# Patient Record
Sex: Female | Born: 1935
Health system: Southern US, Community
[De-identification: ages and names within clinical notes are randomized; demographics above are authoritative.]

## PROBLEM LIST (undated history)

## (undated) DIAGNOSIS — Z923 Personal history of irradiation: Secondary | ICD-10-CM

## (undated) DIAGNOSIS — IMO0002 Reserved for concepts with insufficient information to code with codable children: Secondary | ICD-10-CM

## (undated) DIAGNOSIS — E785 Hyperlipidemia, unspecified: Secondary | ICD-10-CM

## (undated) DIAGNOSIS — Z973 Presence of spectacles and contact lenses: Secondary | ICD-10-CM

## (undated) DIAGNOSIS — C50919 Malignant neoplasm of unspecified site of unspecified female breast: Secondary | ICD-10-CM

## (undated) DIAGNOSIS — IMO0001 Reserved for inherently not codable concepts without codable children: Secondary | ICD-10-CM

## (undated) HISTORY — DX: Malignant neoplasm of unspecified site of unspecified female breast: C50.919

## (undated) HISTORY — DX: Hyperlipidemia, unspecified: E78.5

## (undated) HISTORY — PX: TONSILLECTOMY: SUR1361

## (undated) HISTORY — DX: Reserved for inherently not codable concepts without codable children: IMO0001

## (undated) HISTORY — DX: Reserved for concepts with insufficient information to code with codable children: IMO0002

---

## 1989-10-29 HISTORY — PX: VAGINAL HYSTERECTOMY: SUR661

## 2010-08-23 ENCOUNTER — Encounter: Admission: RE | Admit: 2010-08-23 | Discharge: 2010-08-23 | Payer: Self-pay | Admitting: Specialist

## 2011-01-16 ENCOUNTER — Other Ambulatory Visit: Payer: Self-pay | Admitting: Specialist

## 2011-01-16 DIAGNOSIS — Z09 Encounter for follow-up examination after completed treatment for conditions other than malignant neoplasm: Secondary | ICD-10-CM

## 2011-02-26 ENCOUNTER — Ambulatory Visit
Admission: RE | Admit: 2011-02-26 | Discharge: 2011-02-26 | Disposition: A | Discharge status: Home / Self Care | Payer: Medicare Other | Source: Ambulatory Visit | Attending: Specialist | Admitting: Specialist

## 2011-02-26 DIAGNOSIS — Z09 Encounter for follow-up examination after completed treatment for conditions other than malignant neoplasm: Secondary | ICD-10-CM

## 2011-06-28 ENCOUNTER — Other Ambulatory Visit: Payer: Self-pay | Admitting: Specialist

## 2011-06-28 DIAGNOSIS — R920 Mammographic microcalcification found on diagnostic imaging of breast: Secondary | ICD-10-CM

## 2011-08-27 ENCOUNTER — Other Ambulatory Visit: Payer: Self-pay | Admitting: Specialist

## 2011-08-27 ENCOUNTER — Ambulatory Visit
Admission: RE | Admit: 2011-08-27 | Discharge: 2011-08-27 | Disposition: A | Discharge status: Home / Self Care | Payer: Medicare Other | Source: Ambulatory Visit | Attending: Specialist | Admitting: Specialist

## 2011-08-27 DIAGNOSIS — R920 Mammographic microcalcification found on diagnostic imaging of breast: Secondary | ICD-10-CM

## 2012-04-14 DIAGNOSIS — Z872 Personal history of diseases of the skin and subcutaneous tissue: Secondary | ICD-10-CM | POA: Diagnosis not present

## 2012-04-14 DIAGNOSIS — L570 Actinic keratosis: Secondary | ICD-10-CM | POA: Diagnosis not present

## 2012-06-24 DIAGNOSIS — H2510 Age-related nuclear cataract, unspecified eye: Secondary | ICD-10-CM | POA: Diagnosis not present

## 2012-06-24 DIAGNOSIS — H02839 Dermatochalasis of unspecified eye, unspecified eyelid: Secondary | ICD-10-CM | POA: Diagnosis not present

## 2012-06-24 DIAGNOSIS — H40029 Open angle with borderline findings, high risk, unspecified eye: Secondary | ICD-10-CM | POA: Diagnosis not present

## 2012-07-21 ENCOUNTER — Other Ambulatory Visit: Payer: Self-pay | Admitting: Specialist

## 2012-07-21 DIAGNOSIS — Z1231 Encounter for screening mammogram for malignant neoplasm of breast: Secondary | ICD-10-CM

## 2012-07-23 DIAGNOSIS — E785 Hyperlipidemia, unspecified: Secondary | ICD-10-CM | POA: Diagnosis not present

## 2012-07-23 DIAGNOSIS — Z01419 Encounter for gynecological examination (general) (routine) without abnormal findings: Secondary | ICD-10-CM | POA: Diagnosis not present

## 2012-07-28 DIAGNOSIS — M899 Disorder of bone, unspecified: Secondary | ICD-10-CM | POA: Diagnosis not present

## 2012-07-28 DIAGNOSIS — Z01419 Encounter for gynecological examination (general) (routine) without abnormal findings: Secondary | ICD-10-CM | POA: Diagnosis not present

## 2012-08-15 DIAGNOSIS — Z23 Encounter for immunization: Secondary | ICD-10-CM | POA: Diagnosis not present

## 2012-09-03 ENCOUNTER — Ambulatory Visit
Admission: RE | Admit: 2012-09-03 | Discharge: 2012-09-03 | Disposition: A | Discharge status: Home / Self Care | Payer: Medicare Other | Source: Ambulatory Visit | Attending: Specialist | Admitting: Specialist

## 2012-09-03 DIAGNOSIS — Z1231 Encounter for screening mammogram for malignant neoplasm of breast: Secondary | ICD-10-CM | POA: Diagnosis not present

## 2012-10-09 DIAGNOSIS — L570 Actinic keratosis: Secondary | ICD-10-CM | POA: Diagnosis not present

## 2012-10-09 DIAGNOSIS — Z872 Personal history of diseases of the skin and subcutaneous tissue: Secondary | ICD-10-CM | POA: Diagnosis not present

## 2013-03-30 DIAGNOSIS — Z872 Personal history of diseases of the skin and subcutaneous tissue: Secondary | ICD-10-CM | POA: Diagnosis not present

## 2013-03-30 DIAGNOSIS — L408 Other psoriasis: Secondary | ICD-10-CM | POA: Diagnosis not present

## 2013-04-20 DIAGNOSIS — Z872 Personal history of diseases of the skin and subcutaneous tissue: Secondary | ICD-10-CM | POA: Diagnosis not present

## 2013-04-20 DIAGNOSIS — L408 Other psoriasis: Secondary | ICD-10-CM | POA: Diagnosis not present

## 2013-06-23 DIAGNOSIS — H269 Unspecified cataract: Secondary | ICD-10-CM | POA: Diagnosis not present

## 2013-06-23 DIAGNOSIS — H04129 Dry eye syndrome of unspecified lacrimal gland: Secondary | ICD-10-CM | POA: Diagnosis not present

## 2013-06-23 DIAGNOSIS — H02839 Dermatochalasis of unspecified eye, unspecified eyelid: Secondary | ICD-10-CM | POA: Diagnosis not present

## 2013-07-07 DIAGNOSIS — Z872 Personal history of diseases of the skin and subcutaneous tissue: Secondary | ICD-10-CM | POA: Diagnosis not present

## 2013-07-07 DIAGNOSIS — D481 Neoplasm of uncertain behavior of connective and other soft tissue: Secondary | ICD-10-CM | POA: Diagnosis not present

## 2013-07-07 DIAGNOSIS — D485 Neoplasm of uncertain behavior of skin: Secondary | ICD-10-CM | POA: Diagnosis not present

## 2013-07-07 DIAGNOSIS — L408 Other psoriasis: Secondary | ICD-10-CM | POA: Diagnosis not present

## 2013-07-28 DIAGNOSIS — E785 Hyperlipidemia, unspecified: Secondary | ICD-10-CM | POA: Diagnosis not present

## 2013-07-30 DIAGNOSIS — Z23 Encounter for immunization: Secondary | ICD-10-CM | POA: Diagnosis not present

## 2013-08-07 ENCOUNTER — Other Ambulatory Visit: Payer: Self-pay

## 2013-08-07 DIAGNOSIS — Z1231 Encounter for screening mammogram for malignant neoplasm of breast: Secondary | ICD-10-CM

## 2013-09-07 ENCOUNTER — Ambulatory Visit
Admission: RE | Admit: 2013-09-07 | Discharge: 2013-09-07 | Disposition: A | Discharge status: Home / Self Care | Payer: Medicare Other | Source: Ambulatory Visit

## 2013-09-07 DIAGNOSIS — Z1231 Encounter for screening mammogram for malignant neoplasm of breast: Secondary | ICD-10-CM | POA: Diagnosis not present

## 2013-09-10 ENCOUNTER — Other Ambulatory Visit: Payer: Self-pay | Admitting: Specialist

## 2013-09-10 DIAGNOSIS — R928 Other abnormal and inconclusive findings on diagnostic imaging of breast: Secondary | ICD-10-CM

## 2013-09-29 ENCOUNTER — Ambulatory Visit
Admission: RE | Admit: 2013-09-29 | Discharge: 2013-09-29 | Disposition: A | Discharge status: Home / Self Care | Payer: Medicare Other | Source: Ambulatory Visit | Attending: Specialist | Admitting: Specialist

## 2013-09-29 DIAGNOSIS — R928 Other abnormal and inconclusive findings on diagnostic imaging of breast: Secondary | ICD-10-CM

## 2013-09-29 DIAGNOSIS — R920 Mammographic microcalcification found on diagnostic imaging of breast: Secondary | ICD-10-CM | POA: Diagnosis not present

## 2013-10-06 ENCOUNTER — Ambulatory Visit (INDEPENDENT_AMBULATORY_CARE_PROVIDER_SITE_OTHER): Payer: Medicare Other | Admitting: Surgery

## 2013-10-06 ENCOUNTER — Encounter (INDEPENDENT_AMBULATORY_CARE_PROVIDER_SITE_OTHER): Payer: Self-pay | Admitting: Surgery

## 2013-10-06 VITALS — BP 156/72 | HR 104 | Temp 97.1°F | Resp 18 | Ht 66.0 in | Wt 160.0 lb

## 2013-10-06 DIAGNOSIS — R920 Mammographic microcalcification found on diagnostic imaging of breast: Secondary | ICD-10-CM | POA: Diagnosis not present

## 2013-10-06 NOTE — Patient Instructions (Signed)
Lumpectomy A lumpectomy is a form of "breast conserving" or "breast preservation" surgery. It may also be referred to as a partial mastectomy. During a lumpectomy, the portion of the breast that contains the cancerous tumor or breast mass (the lump) is removed. Some normal tissue around the lump may also be removed to make sure all the tumor has been removed. This surgery should take 40 minutes or less. LET YOUR HEALTH CARE PROVIDER KNOW ABOUT:  Any allergies you have.  All medicines you are taking, including vitamins, herbs, eye drops, creams, and over-the-counter medicines.  Previous problems you or members of your family have had with the use of anesthetics.  Any blood disorders you have.  Previous surgeries you have had.  Medical conditions you have. RISKS AND COMPLICATIONS Generally, this is a safe procedure. However, as with any procedure, complications can occur. Possible complications include:  Bleeding.  Infection.  Pain.  Temporary swelling.  Change in the shape of the breast, particularly if a large portion is removed. BEFORE THE PROCEDURE  Ask your health care provider about changing or stopping your regular medicines.  Do not eat or drink anything for 7 8 hours before the surgery or as directed by your health care provider. Ask your health care provider if you can take a sip of water with any approved medicines.  On the day of surgery, your healthcare provider will use a mammogram or ultrasound to locate and mark the tumor in your breast. These markings on your breast will show where the cut (incision) will be made. PROCEDURE   An IV tube will be put into one of your veins.  You may be given medicine to help you relax before the surgery (sedative). You will be given one of the following:  A medicine that numbs the area (local anesthesia).  A medicine that makes you go to sleep (general anesthesia).  Your health care provider will use a kind of electric scalpel  that uses heat to minimize bleeding (electrocautery knife).  A curved incision (like a smile or frown) that follows the natural curve of your breast is made, to allow for minimal scarring and better healing.  The tumor will be removed with some of the surrounding tissue. This will be sent to the lab for analysis. Your health care provider may also remove your lymph nodes at this time if needed.  Sometimes, but not always, a rubber tube called a drain will be surgically inserted into your breast area or armpit to collect excess fluid that may accumulate in the space where the tumor was. This drain is connected to a plastic bulb on the outside of your body. This drain creates suction to help remove the fluid.  The incisions will be closed with stitches (sutures).  A bandage may be placed over the incisions. AFTER THE PROCEDURE  You will be taken to the recovery area.  You will be given medicine for pain.  A small rubber drain may be placed in the breast for 2 3 days to prevent a collection of blood (hematoma) from developing in the breast. You will be given instructions on caring for the drain before you go home.  A pressure bandage (dressing) will be applied for 1 2 days to prevent bleeding. Ask your health care provider how to care for your bandage at home. Document Released: 11/26/2006 Document Revised: 06/17/2013 Document Reviewed: 03/20/2013 [employer] Patient Information 2014 [employer].  

## 2013-10-06 NOTE — Progress Notes (Signed)
Patient ID: Makayla [name], female   DOB: 01/27/1934, 79 y.o.   MRN: [id number]  Chief Complaint  Patient presents with  . Breast Problem    calcifications    HPI Makayla [name] is a 79 y.o. female.  Patient presents at the request of Dr. Stephen Reed for atypical left breast microcalcifications noted on routine and diagnostic mammography. These are too deep for stereotactic biopsy. Patient denies any history of breast pain, nipple discharge or breast mass. She has a history of multiple repeat mammography or dense breast tissue and microcalcifications. HPI  Past Medical History  Diagnosis Date  . Hyperlipidemia     Past Surgical History  Procedure Laterality Date  . Vaginal hysterectomy  1991    Family History  Problem Relation Age of Onset  . Breast cancer Sister     Social History History  Substance Use Topics  . Smoking status: Never Smoker   . Smokeless tobacco: Not on file  . Alcohol Use: Yes     Comment: occasional wine    Allergies  Allergen Reactions  . Codeine Nausea And Vomiting  . Sulfa Antibiotics Nausea And Vomiting    Current Outpatient Prescriptions  Medication Sig Dispense Refill  . aspirin 81 MG tablet Take 81 mg by mouth daily.      . atorvastatin (LIPITOR) 10 MG tablet Take 10 mg by mouth daily.      . Calcium-Vitamin D-Vitamin K (VIACTIV PO) Take 1,000 mg by mouth daily.      . estrogen-methylTESTOSTERone 0.625-1.25 MG per tablet Take 1 tablet by mouth daily.      . loratadine (CLARITIN) 10 MG tablet Take 10 mg by mouth daily.      . Multiple Vitamin (MULTIVITAMIN) tablet Take 1 tablet by mouth daily.      . Omega-3 Fatty Acids (FISH OIL) 1000 MG CAPS Take by mouth daily.       No current facility-administered medications for this visit.    Review of Systems Review of Systems  Constitutional: Negative for fever, chills and unexpected weight change.  HENT: Negative for congestion, hearing loss, sore throat, trouble swallowing and voice  change.   Eyes: Negative for visual disturbance.  Respiratory: Negative for cough and wheezing.   Cardiovascular: Negative for chest pain, palpitations and leg swelling.  Gastrointestinal: Negative for nausea, vomiting, abdominal pain, diarrhea, constipation, blood in stool, abdominal distention and anal bleeding.  Genitourinary: Negative for hematuria, vaginal bleeding and difficulty urinating.  Musculoskeletal: Negative for arthralgias.  Skin: Negative for rash and wound.  Neurological: Negative for seizures, syncope and headaches.  Hematological: Negative for adenopathy. Does not bruise/bleed easily.  Psychiatric/Behavioral: Negative for confusion.    Blood pressure 156/72, pulse 104, temperature 97.1 F (36.2 C), temperature source Temporal, resp. rate 18, height 5' 6" (1.676 m), weight 160 lb (72.576 kg).  Physical Exam Physical Exam  Constitutional: She is oriented to person, place, and time. She appears well-developed and well-nourished.  HENT:  Head: Normocephalic and atraumatic.  Eyes: Pupils are equal, round, and reactive to light. No scleral icterus.  Neck: Normal range of motion. Neck supple.  Cardiovascular: Normal rate and regular rhythm.   Pulmonary/Chest: Effort normal and breath sounds normal. Right breast exhibits no inverted nipple, no mass, no nipple discharge, no skin change and no tenderness. Left breast exhibits no inverted nipple, no mass, no nipple discharge, no skin change and no tenderness. Breasts are symmetrical.    Abdominal: Soft. Bowel sounds are normal.  Neurological: She is alert and   oriented to person, place, and time.  Skin: Skin is warm and dry.  Psychiatric: She has a normal mood and affect. Her behavior is normal. Judgment and thought content normal.    Data Reviewed The microcalcifications deep in the upper outer left breast could  not be adequately visualized on the stereotactic table to perform a  stereotactic guided core needle biopsy.  Therefore, left breast  mammographic needle localization and surgical excision of the 1.6  group of microcalcifications is recommended. The patient requested  an appointment with a general surgeon in [place], [place].  Therefore, she was given an appointment with Dr. Onika Gudiel at 11 a.m.  on 10/06/2013.   Assessment    Left breast atypical microcalcifications deep upper-outer quadrant    Plan    These are not amenable to stereotactic biopsy. Recommend left breast needle localized excisional biopsy.The procedure has been discussed with the patient. Alternatives to surgery have been discussed with the patient.  Risks of surgery include bleeding,  Infection,  Seroma formation, death,  and the need for further surgery.   The patient understands and wishes to proceed.       Julia Kulzer A. 10/06/2013, 11:56 AM    

## 2013-10-08 DIAGNOSIS — L408 Other psoriasis: Secondary | ICD-10-CM | POA: Diagnosis not present

## 2013-10-08 DIAGNOSIS — Z872 Personal history of diseases of the skin and subcutaneous tissue: Secondary | ICD-10-CM | POA: Diagnosis not present

## 2013-10-26 ENCOUNTER — Encounter (HOSPITAL_BASED_OUTPATIENT_CLINIC_OR_DEPARTMENT_OTHER): Payer: Self-pay | Admitting: *Deleted

## 2013-10-26 NOTE — Progress Notes (Signed)
No labs needed

## 2013-10-29 HISTORY — PX: BREAST LUMPECTOMY: SHX2

## 2013-11-04 ENCOUNTER — Ambulatory Visit (HOSPITAL_BASED_OUTPATIENT_CLINIC_OR_DEPARTMENT_OTHER): Payer: Medicare Other | Admitting: Anesthesiology

## 2013-11-04 ENCOUNTER — Encounter (HOSPITAL_BASED_OUTPATIENT_CLINIC_OR_DEPARTMENT_OTHER)
Admission: RE | Disposition: A | Discharge status: Home / Self Care | Payer: Self-pay | Source: Ambulatory Visit | Attending: Surgery

## 2013-11-04 ENCOUNTER — Encounter (HOSPITAL_BASED_OUTPATIENT_CLINIC_OR_DEPARTMENT_OTHER): Payer: Self-pay | Admitting: *Deleted

## 2013-11-04 ENCOUNTER — Encounter (HOSPITAL_BASED_OUTPATIENT_CLINIC_OR_DEPARTMENT_OTHER): Payer: Medicare Other | Admitting: Anesthesiology

## 2013-11-04 ENCOUNTER — Ambulatory Visit
Admission: RE | Admit: 2013-11-04 | Discharge: 2013-11-04 | Disposition: A | Discharge status: Home / Self Care | Payer: Medicare Other | Source: Ambulatory Visit | Attending: Surgery | Admitting: Surgery

## 2013-11-04 ENCOUNTER — Ambulatory Visit (HOSPITAL_BASED_OUTPATIENT_CLINIC_OR_DEPARTMENT_OTHER)
Admission: RE | Admit: 2013-11-04 | Discharge: 2013-11-04 | Disposition: A | Discharge status: Home / Self Care | Payer: Medicare Other | Source: Ambulatory Visit | Attending: Surgery | Admitting: Surgery

## 2013-11-04 DIAGNOSIS — R920 Mammographic microcalcification found on diagnostic imaging of breast: Secondary | ICD-10-CM

## 2013-11-04 DIAGNOSIS — R928 Other abnormal and inconclusive findings on diagnostic imaging of breast: Secondary | ICD-10-CM | POA: Diagnosis not present

## 2013-11-04 DIAGNOSIS — Z7982 Long term (current) use of aspirin: Secondary | ICD-10-CM | POA: Diagnosis not present

## 2013-11-04 DIAGNOSIS — D0590 Unspecified type of carcinoma in situ of unspecified breast: Secondary | ICD-10-CM | POA: Insufficient documentation

## 2013-11-04 HISTORY — DX: Presence of spectacles and contact lenses: Z97.3

## 2013-11-04 HISTORY — PX: BREAST LUMPECTOMY WITH NEEDLE LOCALIZATION: SHX5759

## 2013-11-04 LAB — POCT HEMOGLOBIN-HEMACUE: Hemoglobin: 15.5 g/dL — ABNORMAL HIGH (ref 12.0–15.0)

## 2013-11-04 SURGERY — BREAST LUMPECTOMY WITH NEEDLE LOCALIZATION
Anesthesia: General | Site: Breast | Laterality: Left

## 2013-11-04 MED ORDER — HYDROMORPHONE HCL PF 1 MG/ML IJ SOLN: 0.2500 mg | INTRAMUSCULAR | Status: DC | PRN

## 2013-11-04 MED ORDER — OXYCODONE HCL 5 MG PO TABS: 5.0000 mg | ORAL_TABLET | Freq: Once | ORAL | Status: DC | PRN

## 2013-11-04 MED ORDER — LIDOCAINE HCL (CARDIAC) 20 MG/ML IV SOLN
INTRAVENOUS | Status: DC | PRN
  Administered 2013-11-04: 75 mg via INTRAVENOUS

## 2013-11-04 MED ORDER — CEFAZOLIN SODIUM 1-5 GM-% IV SOLN
INTRAVENOUS | Status: AC
  Filled 2013-11-04: qty 150

## 2013-11-04 MED ORDER — BUPIVACAINE-EPINEPHRINE 0.25% -1:200000 IJ SOLN
INTRAMUSCULAR | Status: DC | PRN
  Administered 2013-11-04: 20 mL

## 2013-11-04 MED ORDER — FENTANYL CITRATE 0.05 MG/ML IJ SOLN
INTRAMUSCULAR | Status: AC
  Filled 2013-11-04: qty 6

## 2013-11-04 MED ORDER — MIDAZOLAM HCL 5 MG/5ML IJ SOLN
INTRAMUSCULAR | Status: DC | PRN
  Administered 2013-11-04: 1 mg via INTRAVENOUS

## 2013-11-04 MED ORDER — BUPIVACAINE-EPINEPHRINE PF 0.25-1:200000 % IJ SOLN
INTRAMUSCULAR | Status: AC
  Filled 2013-11-04: qty 30

## 2013-11-04 MED ORDER — FENTANYL CITRATE 0.05 MG/ML IJ SOLN
INTRAMUSCULAR | Status: DC | PRN
  Administered 2013-11-04: 100 ug via INTRAVENOUS

## 2013-11-04 MED ORDER — DEXAMETHASONE SODIUM PHOSPHATE 4 MG/ML IJ SOLN
INTRAMUSCULAR | Status: DC | PRN
  Administered 2013-11-04: 10 mg via INTRAVENOUS

## 2013-11-04 MED ORDER — MIDAZOLAM HCL 2 MG/2ML IJ SOLN
INTRAMUSCULAR | Status: AC
  Filled 2013-11-04: qty 2

## 2013-11-04 MED ORDER — METOCLOPRAMIDE HCL 5 MG/ML IJ SOLN: 10.0000 mg | Freq: Once | INTRAMUSCULAR | Status: DC | PRN

## 2013-11-04 MED ORDER — PROPOFOL 10 MG/ML IV BOLUS
INTRAVENOUS | Status: DC | PRN
  Administered 2013-11-04: 250 mg via INTRAVENOUS

## 2013-11-04 MED ORDER — BUPIVACAINE-EPINEPHRINE PF 0.5-1:200000 % IJ SOLN
INTRAMUSCULAR | Status: AC
  Filled 2013-11-04: qty 30

## 2013-11-04 MED ORDER — OXYCODONE HCL 5 MG/5ML PO SOLN: 5.0000 mg | Freq: Once | ORAL | Status: DC | PRN

## 2013-11-04 MED ORDER — HYDROCODONE-ACETAMINOPHEN 5-325 MG PO TABS
1.0000 | ORAL_TABLET | Freq: Four times a day (QID) | ORAL | Status: DC | PRN
Start: 2013-11-04 — End: 2013-11-16

## 2013-11-04 MED ORDER — CHLORHEXIDINE GLUCONATE 4 % EX LIQD: 1.0000 "application " | Freq: Once | CUTANEOUS | Status: DC

## 2013-11-04 MED ORDER — LACTATED RINGERS IV SOLN
INTRAVENOUS | Status: DC
  Administered 2013-11-04 (×2): via INTRAVENOUS

## 2013-11-04 MED ORDER — ONDANSETRON HCL 4 MG/2ML IJ SOLN
INTRAMUSCULAR | Status: DC | PRN
  Administered 2013-11-04: 4 mg via INTRAVENOUS

## 2013-11-04 MED ORDER — DEXTROSE 5 % IV SOLN
3.0000 g | INTRAVENOUS | Status: AC
  Administered 2013-11-04: 2 g via INTRAVENOUS

## 2013-11-04 SURGICAL SUPPLY — 48 items
APPLIER CLIP 11 MED OPEN (CLIP)
BINDER BREAST LRG (GAUZE/BANDAGES/DRESSINGS) IMPLANT
BINDER BREAST MEDIUM (GAUZE/BANDAGES/DRESSINGS) IMPLANT
BINDER BREAST XLRG (GAUZE/BANDAGES/DRESSINGS) IMPLANT
BINDER BREAST XXLRG (GAUZE/BANDAGES/DRESSINGS) IMPLANT
BIOPATCH RED 1 DISK 7.0 (GAUZE/BANDAGES/DRESSINGS) IMPLANT
BLADE SURG 15 STRL LF DISP TIS (BLADE) ×1 IMPLANT
BLADE SURG 15 STRL SS (BLADE) ×1
CANISTER SUCT 1200ML W/VALVE (MISCELLANEOUS) ×2 IMPLANT
CHLORAPREP W/TINT 26ML (MISCELLANEOUS) ×2 IMPLANT
CLIP APPLIE 11 MED OPEN (CLIP) IMPLANT
CLIP TI WIDE RED SMALL 6 (CLIP) IMPLANT
COVER MAYO STAND STRL (DRAPES) ×2 IMPLANT
COVER TABLE BACK 60X90 (DRAPES) ×2 IMPLANT
DECANTER SPIKE VIAL GLASS SM (MISCELLANEOUS) ×2 IMPLANT
DERMABOND ADVANCED (GAUZE/BANDAGES/DRESSINGS) ×1
DERMABOND ADVANCED .7 DNX12 (GAUZE/BANDAGES/DRESSINGS) ×1 IMPLANT
DEVICE DUBIN W/COMP PLATE 8390 (MISCELLANEOUS) ×2 IMPLANT
DRAIN CHANNEL 19F RND (DRAIN) IMPLANT
DRAPE LAPAROSCOPIC ABDOMINAL (DRAPES) IMPLANT
DRAPE PED LAPAROTOMY (DRAPES) ×2 IMPLANT
DRAPE UTILITY XL STRL (DRAPES) ×2 IMPLANT
ELECT COATED BLADE 2.86 ST (ELECTRODE) ×2 IMPLANT
ELECT REM PT RETURN 9FT ADLT (ELECTROSURGICAL) ×2
ELECTRODE REM PT RTRN 9FT ADLT (ELECTROSURGICAL) ×1 IMPLANT
EVACUATOR SILICONE 100CC (DRAIN) IMPLANT
GLOVE BIOGEL PI IND STRL 8 (GLOVE) ×1 IMPLANT
GLOVE BIOGEL PI INDICATOR 8 (GLOVE) ×1
GLOVE ECLIPSE 8.0 STRL XLNG CF (GLOVE) ×2 IMPLANT
GOWN STRL REUS W/ TWL LRG LVL3 (GOWN DISPOSABLE) ×2 IMPLANT
GOWN STRL REUS W/TWL LRG LVL3 (GOWN DISPOSABLE) ×2
KIT MARKER MARGIN INK (KITS) ×2 IMPLANT
NEEDLE HYPO 25X1 1.5 SAFETY (NEEDLE) ×2 IMPLANT
NS IRRIG 1000ML POUR BTL (IV SOLUTION) ×2 IMPLANT
PACK BASIN DAY SURGERY FS (CUSTOM PROCEDURE TRAY) ×2 IMPLANT
PENCIL BUTTON HOLSTER BLD 10FT (ELECTRODE) ×2 IMPLANT
SLEEVE SCD COMPRESS KNEE MED (MISCELLANEOUS) ×2 IMPLANT
SPONGE LAP 4X18 X RAY DECT (DISPOSABLE) ×2 IMPLANT
SUT MON AB 4-0 PC3 18 (SUTURE) ×2 IMPLANT
SUT SILK 2 0 SH (SUTURE) IMPLANT
SUT VIC AB 3-0 SH 27 (SUTURE) ×1
SUT VIC AB 3-0 SH 27X BRD (SUTURE) ×1 IMPLANT
SUT VICRYL 3-0 CR8 SH (SUTURE) ×2 IMPLANT
SYR CONTROL 10ML LL (SYRINGE) ×2 IMPLANT
TOWEL OR 17X24 6PK STRL BLUE (TOWEL DISPOSABLE) ×4 IMPLANT
TOWEL OR NON WOVEN STRL DISP B (DISPOSABLE) ×2 IMPLANT
TUBE CONNECTING 20X1/4 (TUBING) ×2 IMPLANT
YANKAUER SUCT BULB TIP NO VENT (SUCTIONS) ×2 IMPLANT

## 2013-11-04 NOTE — Anesthesia Preprocedure Evaluation (Signed)
Anesthesia Evaluation  Patient identified by MRN, date of birth, ID band Patient awake    Reviewed: Allergy & Precautions, H&P , NPO status , Patient's Chart, lab work & pertinent test results, reviewed documented beta blocker date and time   Airway Mallampati: II TM Distance: >3 FB Neck ROM: full    Dental   Pulmonary neg pulmonary ROS,  breath sounds clear to auscultation        Cardiovascular negative cardio ROS  Rhythm:regular     Neuro/Psych negative neurological ROS  negative psych ROS   GI/Hepatic negative GI ROS, Neg liver ROS,   Endo/Other  negative endocrine ROS  Renal/GU negative Renal ROS  negative genitourinary   Musculoskeletal   Abdominal   Peds  Hematology negative hematology ROS (+)   Anesthesia Other Findings See surgeon's H&P   Reproductive/Obstetrics negative OB ROS                           Anesthesia Physical Anesthesia Plan  ASA: II  Anesthesia Plan: General   Post-op Pain Management:    Induction: Intravenous  Airway Management Planned: LMA  Additional Equipment:   Intra-op Plan:   Post-operative Plan:   Informed Consent: I have reviewed the patients History and Physical, chart, labs and discussed the procedure including the risks, benefits and alternatives for the proposed anesthesia with the patient or authorized representative who has indicated his/her understanding and acceptance.   Dental Advisory Given  Plan Discussed with: CRNA and Surgeon  Anesthesia Plan Comments:         Anesthesia Quick Evaluation  

## 2013-11-04 NOTE — Discharge Instructions (Signed)
[facility] °Office Phone Number [phone] ° °BREAST BIOPSY/ PARTIAL MASTECTOMY: POST OP INSTRUCTIONS ° °Always review your discharge instruction sheet given to you by the facility where your surgery was performed. ° °IF YOU HAVE DISABILITY OR FAMILY LEAVE FORMS, YOU MUST BRING THEM TO THE OFFICE FOR PROCESSING.  DO NOT GIVE THEM TO YOUR DOCTOR. ° °1. A prescription for pain medication may be given to you upon discharge.  Take your pain medication as prescribed, if needed.  If narcotic pain medicine is not needed, then you may take acetaminophen (Tylenol) or ibuprofen (Advil) as needed. °2. Take your usually prescribed medications unless otherwise directed °3. If you need a refill on your pain medication, please contact your pharmacy.  They will contact our office to request authorization.  Prescriptions will not be filled after 5pm or on week-ends. °4. You should eat very light the first 24 hours after surgery, such as soup, crackers, pudding, etc.  Resume your normal diet the day after surgery. °5. Most patients will experience some swelling and bruising in the breast.  Ice packs and a good support bra will help.  Swelling and bruising can take several days to resolve.  °6. It is common to experience some constipation if taking pain medication after surgery.  Increasing fluid intake and taking a stool softener will usually help or prevent this problem from occurring.  A mild laxative (Milk of Magnesia or Miralax) should be taken according to package directions if there are no bowel movements after 48 hours. °7. Unless discharge instructions indicate otherwise, you may remove your bandages 24-48 hours after surgery, and you may shower at that time.  You may have steri-strips (small skin tapes) in place directly over the incision.  These strips should be left on the skin for 7-10 days.  If your surgeon used skin glue on the incision, you may shower in 24 hours.  The glue will flake off over the  next 2-3 weeks.  Any sutures or staples will be removed at the office during your follow-up visit. °8. ACTIVITIES:  You may resume regular daily activities (gradually increasing) beginning the next day.  Wearing a good support bra or sports bra minimizes pain and swelling.  You may have sexual intercourse when it is comfortable. °a. You may drive when you no longer are taking prescription pain medication, you can comfortably wear a seatbelt, and you can safely maneuver your car and apply brakes. °b. RETURN TO WORK:  ______________________________________________________________________________________ °9. You should see your doctor in the office for a follow-up appointment approximately two weeks after your surgery.  Your doctor’s nurse will typically make your follow-up appointment when she calls you with your pathology report.  Expect your pathology report 2-3 business days after your surgery.  You may call to check if you do not hear from us after three days. °10. OTHER INSTRUCTIONS: _______________________________________________________________________________________________ _____________________________________________________________________________________________________________________________________ °_____________________________________________________________________________________________________________________________________ °_____________________________________________________________________________________________________________________________________ ° °WHEN TO CALL YOUR DOCTOR: °1. Fever over 101.0 °2. Nausea and/or vomiting. °3. Extreme swelling or bruising. °4. Continued bleeding from incision. °5. Increased pain, redness, or drainage from the incision. ° °The clinic staff is available to answer your questions during regular business hours.  Please don’t hesitate to call and ask to speak to one of the nurses for clinical concerns.  If you have a medical emergency, go to the nearest  emergency room or call 911.  A surgeon from [facility] is always on call at the hospital. ° °For further questions, please visit [email or web address]  ° ° °  Post Anesthesia Home Care Instructions ° °Activity: °Get plenty of rest for the remainder of the day. A responsible adult should stay with you for 24 hours following the procedure.  °For the next 24 hours, DO NOT: °-Drive a car °-Operate machinery °-Drink alcoholic beverages °-Take any medication unless instructed by your physician °-Make any legal decisions or sign important papers. ° °Meals: °Start with liquid foods such as gelatin or soup. Progress to regular foods as tolerated. Avoid greasy, spicy, heavy foods. If nausea and/or vomiting occur, drink only clear liquids until the nausea and/or vomiting subsides. Call your physician if vomiting continues. ° °Special Instructions/Symptoms: °Your throat may feel dry or sore from the anesthesia or the breathing tube placed in your throat during surgery. If this causes discomfort, gargle with warm salt water. The discomfort should disappear within 24 hours. ° °

## 2013-11-04 NOTE — Transfer of Care (Signed)
Immediate Anesthesia Transfer of Care Note  Patient: Makayla [name]  Procedure(s) Performed: Procedure(s): BREAST LUMPECTOMY WITH NEEDLE LOCALIZATION (Left)  Patient Location: PACU  Anesthesia Type:General  Level of Consciousness: awake  Airway & Oxygen Therapy: Patient Spontanous Breathing and Patient connected to face mask oxygen  Post-op Assessment: Report given to PACU RN and Post -op Vital signs reviewed and stable  Post vital signs: Reviewed and stable  Complications: No apparent anesthesia complications

## 2013-11-04 NOTE — Op Note (Signed)
Preoperative diagnosis: left  Breast atypical calcifications  Postop diagnosis: Same  Procedure: left breast lumpectomy with wire localization  Surgeon: Erroll Luna M.D.  Anesthesia: LMA with 0.25% Sensorcaine local with epinephrine  EBL: Less than 40 cc  Specimen: Breast mass with wire and calcifications verified by radiography to pathology  Drains: None  Indications for procedure: The patient presents with atypical microcalcifications left breast too deep for percutaneous biopsy. Options of excision vs observation discussed.  Pt wished to proceed with excision with wire localization.  The procedure has been discussed with the patient. Alternatives to surgery have been discussed with the patient.  Risks of surgery include bleeding,  Infection,  Seroma formation, death,  and the need for further surgery.   The patient understands and wishes to proceed.  Description of procedure: The patient was seen in the holding area and the appropriate side was marked. Questions are answered. Wire localization was done the radiology. The patient was taken back to the operating room and placed supine on the operating room table. After induction of general anesthesia, chest and upper arm  were prepped and draped in a sterile fashion. Timeout was done and she received preoperative antibiotics. Curvilinear incision was made around the wire insertion site in the upper outer quadrant left breast. All tissue around the wire was excised and hemostasis was achieved with cautery. The area was removed in its entirety upon gross examination. Gross margin negative. Radiograph revealed the calcifications and clip  to be in the specimen. The wound was closed in layers using 3-0 Vicryl and 4-0 Monocryl subcuticular stitch. Dermabond applied. All final counts found to be correct. Patient awoke extubated taken recovery in satisfactory condition.

## 2013-11-04 NOTE — Anesthesia Postprocedure Evaluation (Signed)
Anesthesia Post Note  Patient: Makayla [name]  Procedure(s) Performed: Procedure(s) (LRB): BREAST LUMPECTOMY WITH NEEDLE LOCALIZATION (Left)  Anesthesia type: General  Patient location: PACU  Post pain: Pain level controlled  Post assessment: Patient's Cardiovascular Status Stable  Last Vitals:  Filed Vitals:   11/04/13 1115  BP: 148/66  Pulse: 77  Temp:   Resp: 17    Post vital signs: Reviewed and stable  Level of consciousness: alert  Complications: No apparent anesthesia complications

## 2013-11-04 NOTE — H&P (View-Only) (Signed)
Patient ID: Makayla [name], female   DOB: Mar 11, 1934, 80 y.o.   MRN: [id number]  Chief Complaint  Patient presents with  . Breast Problem    calcifications    HPI Makayla [name] is a 80 y.o. female.  Patient presents at the request of Dr. Remer Macho for atypical left breast microcalcifications noted on routine and diagnostic mammography. These are too deep for stereotactic biopsy. Patient denies any history of breast pain, nipple discharge or breast mass. She has a history of multiple repeat mammography or dense breast tissue and microcalcifications. HPI  Past Medical History  Diagnosis Date  . Hyperlipidemia     Past Surgical History  Procedure Laterality Date  . Vaginal hysterectomy  1991    Family History  Problem Relation Age of Onset  . Breast cancer Sister     Social History History  Substance Use Topics  . Smoking status: Never Smoker   . Smokeless tobacco: Not on file  . Alcohol Use: Yes     Comment: occasional wine    Allergies  Allergen Reactions  . Codeine Nausea And Vomiting  . Sulfa Antibiotics Nausea And Vomiting    Current Outpatient Prescriptions  Medication Sig Dispense Refill  . aspirin 81 MG tablet Take 81 mg by mouth daily.      Marland Kitchen atorvastatin (LIPITOR) 10 MG tablet Take 10 mg by mouth daily.      . Calcium-Vitamin D-Vitamin K (VIACTIV PO) Take 1,000 mg by mouth daily.      Marland Kitchen estrogen-methylTESTOSTERone 0.625-1.25 MG per tablet Take 1 tablet by mouth daily.      Marland Kitchen loratadine (CLARITIN) 10 MG tablet Take 10 mg by mouth daily.      . Multiple Vitamin (MULTIVITAMIN) tablet Take 1 tablet by mouth daily.      . Omega-3 Fatty Acids (FISH OIL) 1000 MG CAPS Take by mouth daily.       No current facility-administered medications for this visit.    Review of Systems Review of Systems  Constitutional: Negative for fever, chills and unexpected weight change.  HENT: Negative for congestion, hearing loss, sore throat, trouble swallowing and voice  change.   Eyes: Negative for visual disturbance.  Respiratory: Negative for cough and wheezing.   Cardiovascular: Negative for chest pain, palpitations and leg swelling.  Gastrointestinal: Negative for nausea, vomiting, abdominal pain, diarrhea, constipation, blood in stool, abdominal distention and anal bleeding.  Genitourinary: Negative for hematuria, vaginal bleeding and difficulty urinating.  Musculoskeletal: Negative for arthralgias.  Skin: Negative for rash and wound.  Neurological: Negative for seizures, syncope and headaches.  Hematological: Negative for adenopathy. Does not bruise/bleed easily.  Psychiatric/Behavioral: Negative for confusion.    Blood pressure 156/72, pulse 104, temperature 97.1 F (36.2 C), temperature source Temporal, resp. rate 18, height 5\' 6"  (1.676 m), weight 160 lb (72.576 kg).  Physical Exam Physical Exam  Constitutional: She is oriented to person, place, and time. She appears well-developed and well-nourished.  HENT:  Head: Normocephalic and atraumatic.  Eyes: Pupils are equal, round, and reactive to light. No scleral icterus.  Neck: Normal range of motion. Neck supple.  Cardiovascular: Normal rate and regular rhythm.   Pulmonary/Chest: Effort normal and breath sounds normal. Right breast exhibits no inverted nipple, no mass, no nipple discharge, no skin change and no tenderness. Left breast exhibits no inverted nipple, no mass, no nipple discharge, no skin change and no tenderness. Breasts are symmetrical.    Abdominal: Soft. Bowel sounds are normal.  Neurological: She is alert and  oriented to person, place, and time.  Skin: Skin is warm and dry.  Psychiatric: She has a normal mood and affect. Her behavior is normal. Judgment and thought content normal.    Data Reviewed The microcalcifications deep in the upper outer left breast could  not be adequately visualized on the stereotactic table to perform a  stereotactic guided core needle biopsy.  Therefore, left breast  mammographic needle localization and surgical excision of the 1.6  group of microcalcifications is recommended. The patient requested  an appointment with a general surgeon in [place], [place].  Therefore, she was given an appointment with Dr. Brantley Stage at 11 a.m.  on 10/06/2013.   Assessment    Left breast atypical microcalcifications deep upper-outer quadrant    Plan    These are not amenable to stereotactic biopsy. Recommend left breast needle localized excisional biopsy.The procedure has been discussed with the patient. Alternatives to surgery have been discussed with the patient.  Risks of surgery include bleeding,  Infection,  Seroma formation, death,  and the need for further surgery.   The patient understands and wishes to proceed.       Karelly Dewalt A. 10/06/2013, 11:56 AM

## 2013-11-04 NOTE — Anesthesia Procedure Notes (Signed)
Procedure Name: LMA Insertion Date/Time: 11/04/2013 9:57 AM Performed by: Melynda Ripple D Pre-anesthesia Checklist: Patient identified, Emergency Drugs available, Suction available and Patient being monitored Patient Re-evaluated:Patient Re-evaluated prior to inductionOxygen Delivery Method: Circle System Utilized Preoxygenation: Pre-oxygenation with 100% oxygen Intubation Type: IV induction Ventilation: Mask ventilation without difficulty LMA: LMA inserted LMA Size: 4.0 Number of attempts: 1 Airway Equipment and Method: bite block Placement Confirmation: positive ETCO2 Tube secured with: Tape Dental Injury: Teeth and Oropharynx as per pre-operative assessment

## 2013-11-04 NOTE — Interval H&P Note (Signed)
History and Physical Interval Note:  11/04/2013 9:27 AM  Makayla [name]  has presented today for surgery, with the diagnosis of breast  The various methods of treatment have been discussed with the patient and family. After consideration of risks, benefits and other options for treatment, the patient has consented to  Procedure(s): BREAST LUMPECTOMY WITH NEEDLE LOCALIZATION (Left) as a surgical intervention .  The patient's history has been reviewed, patient examined, no change in status, stable for surgery.  I have reviewed the patient's chart and labs.  Questions were answered to the patient's satisfaction.     Melora Menon A.

## 2013-11-05 ENCOUNTER — Encounter (HOSPITAL_BASED_OUTPATIENT_CLINIC_OR_DEPARTMENT_OTHER): Payer: Self-pay | Admitting: Surgery

## 2013-11-06 ENCOUNTER — Telehealth (INDEPENDENT_AMBULATORY_CARE_PROVIDER_SITE_OTHER): Payer: Self-pay

## 2013-11-06 NOTE — Telephone Encounter (Signed)
Pt called for path report. Looked at results and see cancer diagnoses with positive margins. Advised patient Dr Brantley Stage will be available on Monday to call her with the results. Please call her at [phone].

## 2013-11-09 ENCOUNTER — Other Ambulatory Visit (INDEPENDENT_AMBULATORY_CARE_PROVIDER_SITE_OTHER): Payer: Self-pay | Admitting: Surgery

## 2013-11-09 NOTE — Telephone Encounter (Signed)
Called this am.

## 2013-11-16 ENCOUNTER — Encounter (INDEPENDENT_AMBULATORY_CARE_PROVIDER_SITE_OTHER): Payer: Self-pay | Admitting: Surgery

## 2013-11-16 ENCOUNTER — Encounter (INDEPENDENT_AMBULATORY_CARE_PROVIDER_SITE_OTHER): Payer: Self-pay

## 2013-11-16 ENCOUNTER — Encounter: Payer: Self-pay | Admitting: *Deleted

## 2013-11-16 ENCOUNTER — Other Ambulatory Visit (INDEPENDENT_AMBULATORY_CARE_PROVIDER_SITE_OTHER): Payer: Self-pay

## 2013-11-16 ENCOUNTER — Ambulatory Visit (INDEPENDENT_AMBULATORY_CARE_PROVIDER_SITE_OTHER): Payer: Medicare Other | Admitting: Surgery

## 2013-11-16 VITALS — BP 128/76 | HR 72 | Temp 98.2°F | Resp 14 | Ht 66.0 in | Wt 159.4 lb

## 2013-11-16 DIAGNOSIS — D0510 Intraductal carcinoma in situ of unspecified breast: Secondary | ICD-10-CM

## 2013-11-16 DIAGNOSIS — D0590 Unspecified type of carcinoma in situ of unspecified breast: Secondary | ICD-10-CM

## 2013-11-16 NOTE — Patient Instructions (Signed)
Will refer to medical and radiation oncology at [facility] for opinion.

## 2013-11-16 NOTE — Progress Notes (Signed)
Patient returns after left breast lumpectomy for DCIS. She had one focal positive margin. She is doing well.  Exam: Left breast incision clean dry and intact without signs of infection.  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 1 of 3 FINAL for [name], Makayla ([id number]) Diagnosis(continued) - DUCTAL CARCINOMA IN SITU INVOLVES APPROXIMATELY 2.1 CM WORTH OF BREAST TISSUE. - ANTERIOR MARGIN IS FOCALLY POSITIVE FOR DUCTAL CARCINOMA IN SITU. - OTHER MARGINS ARE NEGATIVE. - SEE ONCOLOGY TEMPLATE. Microscopic Comment  Impression: Left breast DCIS with focal positive margin  Plan: Discussed reexcision with the patient today the rationale for doing so. She is set up to that in a couple weeks. She will need medical and radiation oncology afterwards.The procedure has been discussed with the patient. Alternatives to surgery have been discussed with the patient.  Risks of surgery include bleeding,  Infection,  Seroma formation, death,  and the need for further surgery.   The patient understands and wishes to proceed. 

## 2013-11-16 NOTE — Progress Notes (Signed)
[place] referral in [place].  Took paperwork to Dr. Humphrey Rolls for an appt.  Emailed Dr. Brantley Stage at [facility] to make aware.

## 2013-11-17 ENCOUNTER — Telehealth: Payer: Self-pay | Admitting: *Deleted

## 2013-11-17 NOTE — Telephone Encounter (Signed)
Received appt date and time from Dr. Humphrey Rolls.  Called and confirmed 12/15/13 appt w/ pt.  Mailed before appt letter, welcome packet & intake form to pt.  Emailed Dr. Brantley Stage at [facility] to make aware.  Took paperwork to Med [facility] for chart.

## 2013-11-27 ENCOUNTER — Encounter: Payer: Self-pay | Admitting: *Deleted

## 2013-11-27 NOTE — Progress Notes (Signed)
Completed chart and gave to [facility] to enter labs and return to me.

## 2013-11-30 ENCOUNTER — Other Ambulatory Visit: Payer: Self-pay | Admitting: *Deleted

## 2013-11-30 ENCOUNTER — Encounter: Payer: Self-pay | Admitting: *Deleted

## 2013-11-30 DIAGNOSIS — C50412 Malignant neoplasm of upper-outer quadrant of left female breast: Secondary | ICD-10-CM

## 2013-11-30 NOTE — Progress Notes (Signed)
Received chart back from [facility] and added to spreadsheet & placed chart in Dr. Laurelyn Sickle box.

## 2013-12-07 ENCOUNTER — Encounter (HOSPITAL_BASED_OUTPATIENT_CLINIC_OR_DEPARTMENT_OTHER): Payer: Self-pay | Admitting: *Deleted

## 2013-12-07 NOTE — Progress Notes (Signed)
No labs needed

## 2013-12-08 ENCOUNTER — Encounter (HOSPITAL_BASED_OUTPATIENT_CLINIC_OR_DEPARTMENT_OTHER): Payer: Self-pay | Admitting: *Deleted

## 2013-12-08 ENCOUNTER — Ambulatory Visit (HOSPITAL_BASED_OUTPATIENT_CLINIC_OR_DEPARTMENT_OTHER): Payer: Medicare Other | Admitting: Anesthesiology

## 2013-12-08 ENCOUNTER — Encounter (HOSPITAL_BASED_OUTPATIENT_CLINIC_OR_DEPARTMENT_OTHER): Payer: Medicare Other | Admitting: Anesthesiology

## 2013-12-08 ENCOUNTER — Encounter (HOSPITAL_BASED_OUTPATIENT_CLINIC_OR_DEPARTMENT_OTHER)
Admission: RE | Disposition: A | Discharge status: Home / Self Care | Payer: Self-pay | Source: Ambulatory Visit | Attending: Surgery

## 2013-12-08 ENCOUNTER — Ambulatory Visit (HOSPITAL_BASED_OUTPATIENT_CLINIC_OR_DEPARTMENT_OTHER)
Admission: RE | Admit: 2013-12-08 | Discharge: 2013-12-08 | Disposition: A | Discharge status: Home / Self Care | Payer: Medicare Other | Source: Ambulatory Visit | Attending: Surgery | Admitting: Surgery

## 2013-12-08 DIAGNOSIS — D0590 Unspecified type of carcinoma in situ of unspecified breast: Secondary | ICD-10-CM

## 2013-12-08 DIAGNOSIS — C50412 Malignant neoplasm of upper-outer quadrant of left female breast: Secondary | ICD-10-CM

## 2013-12-08 DIAGNOSIS — D249 Benign neoplasm of unspecified breast: Secondary | ICD-10-CM | POA: Diagnosis not present

## 2013-12-08 HISTORY — PX: RE-EXCISION OF BREAST LUMPECTOMY: SHX6048

## 2013-12-08 LAB — POCT HEMOGLOBIN-HEMACUE: Hemoglobin: 14.4 g/dL (ref 12.0–15.0)

## 2013-12-08 SURGERY — EXCISION, LESION, BREAST
Anesthesia: General | Site: Breast | Laterality: Left

## 2013-12-08 MED ORDER — ONDANSETRON HCL 4 MG/2ML IJ SOLN: 4.0000 mg | Freq: Once | INTRAMUSCULAR | Status: DC | PRN

## 2013-12-08 MED ORDER — BUPIVACAINE-EPINEPHRINE PF 0.25-1:200000 % IJ SOLN
INTRAMUSCULAR | Status: AC
  Filled 2013-12-08: qty 30

## 2013-12-08 MED ORDER — FENTANYL CITRATE 0.05 MG/ML IJ SOLN
INTRAMUSCULAR | Status: DC | PRN
  Administered 2013-12-08 (×2): 50 ug via INTRAVENOUS

## 2013-12-08 MED ORDER — FENTANYL CITRATE 0.05 MG/ML IJ SOLN: 50.0000 ug | INTRAMUSCULAR | Status: DC | PRN

## 2013-12-08 MED ORDER — OXYCODONE HCL 5 MG PO TABS: 5.0000 mg | ORAL_TABLET | Freq: Once | ORAL | Status: DC | PRN

## 2013-12-08 MED ORDER — LIDOCAINE HCL (CARDIAC) 20 MG/ML IV SOLN
INTRAVENOUS | Status: DC | PRN
  Administered 2013-12-08: 100 mg via INTRAVENOUS

## 2013-12-08 MED ORDER — BUPIVACAINE-EPINEPHRINE PF 0.25-1:200000 % IJ SOLN
INTRAMUSCULAR | Status: DC | PRN
  Administered 2013-12-08: 10 mL

## 2013-12-08 MED ORDER — PROPOFOL 10 MG/ML IV BOLUS
INTRAVENOUS | Status: DC | PRN
  Administered 2013-12-08: 200 mg via INTRAVENOUS

## 2013-12-08 MED ORDER — DEXAMETHASONE SODIUM PHOSPHATE 4 MG/ML IJ SOLN
INTRAMUSCULAR | Status: DC | PRN
  Administered 2013-12-08: 10 mg via INTRAVENOUS

## 2013-12-08 MED ORDER — FENTANYL CITRATE 0.05 MG/ML IJ SOLN
INTRAMUSCULAR | Status: AC
  Filled 2013-12-08: qty 4

## 2013-12-08 MED ORDER — LACTATED RINGERS IV SOLN
INTRAVENOUS | Status: DC
  Administered 2013-12-08: 20 mL/h via INTRAVENOUS

## 2013-12-08 MED ORDER — OXYCODONE HCL 5 MG/5ML PO SOLN: 5.0000 mg | Freq: Once | ORAL | Status: DC | PRN

## 2013-12-08 MED ORDER — HYDROCODONE-ACETAMINOPHEN 5-325 MG PO TABS: 1.0000 | ORAL_TABLET | Freq: Four times a day (QID) | ORAL | Status: DC | PRN

## 2013-12-08 MED ORDER — CHLORHEXIDINE GLUCONATE 4 % EX LIQD: 1.0000 "application " | Freq: Once | CUTANEOUS | Status: DC

## 2013-12-08 MED ORDER — MIDAZOLAM HCL 2 MG/2ML IJ SOLN: 1.0000 mg | INTRAMUSCULAR | Status: DC | PRN

## 2013-12-08 MED ORDER — CEFAZOLIN SODIUM 10 G IJ SOLR
3.0000 g | INTRAMUSCULAR | Status: AC
  Administered 2013-12-08: 2 g via INTRAVENOUS

## 2013-12-08 MED ORDER — HYDROMORPHONE HCL PF 1 MG/ML IJ SOLN: 0.2500 mg | INTRAMUSCULAR | Status: DC | PRN

## 2013-12-08 MED ORDER — ONDANSETRON HCL 4 MG/2ML IJ SOLN
INTRAMUSCULAR | Status: DC | PRN
  Administered 2013-12-08: 4 mg via INTRAVENOUS

## 2013-12-08 SURGICAL SUPPLY — 47 items
BINDER BREAST LRG (GAUZE/BANDAGES/DRESSINGS) ×2 IMPLANT
BINDER BREAST MEDIUM (GAUZE/BANDAGES/DRESSINGS) IMPLANT
BINDER BREAST XLRG (GAUZE/BANDAGES/DRESSINGS) IMPLANT
BINDER BREAST XXLRG (GAUZE/BANDAGES/DRESSINGS) IMPLANT
BLADE SURG 15 STRL LF DISP TIS (BLADE) ×1 IMPLANT
BLADE SURG 15 STRL SS (BLADE) ×1
CANISTER SUCT 1200ML W/VALVE (MISCELLANEOUS) ×2 IMPLANT
CHLORAPREP W/TINT 26ML (MISCELLANEOUS) ×2 IMPLANT
CLIP TI WIDE RED SMALL 6 (CLIP) ×2 IMPLANT
COVER MAYO STAND STRL (DRAPES) ×2 IMPLANT
COVER TABLE BACK 60X90 (DRAPES) ×2 IMPLANT
DECANTER SPIKE VIAL GLASS SM (MISCELLANEOUS) IMPLANT
DERMABOND ADVANCED (GAUZE/BANDAGES/DRESSINGS) ×1
DERMABOND ADVANCED .7 DNX12 (GAUZE/BANDAGES/DRESSINGS) ×1 IMPLANT
DEVICE DUBIN W/COMP PLATE 8390 (MISCELLANEOUS) IMPLANT
DRAPE LAPAROSCOPIC ABDOMINAL (DRAPES) IMPLANT
DRAPE PED LAPAROTOMY (DRAPES) ×2 IMPLANT
DRAPE UTILITY XL STRL (DRAPES) ×2 IMPLANT
ELECT COATED BLADE 2.86 ST (ELECTRODE) ×2 IMPLANT
ELECT REM PT RETURN 9FT ADLT (ELECTROSURGICAL) ×2
ELECTRODE REM PT RTRN 9FT ADLT (ELECTROSURGICAL) ×1 IMPLANT
GLOVE BIOGEL PI IND STRL 7.0 (GLOVE) ×1 IMPLANT
GLOVE BIOGEL PI IND STRL 8 (GLOVE) ×2 IMPLANT
GLOVE BIOGEL PI INDICATOR 7.0 (GLOVE) ×1
GLOVE BIOGEL PI INDICATOR 8 (GLOVE) ×2
GLOVE ECLIPSE 8.0 STRL XLNG CF (GLOVE) ×2 IMPLANT
GLOVE SURG SS PI 8.0 STRL IVOR (GLOVE) ×2 IMPLANT
GOWN STRL REUS W/ TWL LRG LVL3 (GOWN DISPOSABLE) ×2 IMPLANT
GOWN STRL REUS W/TWL LRG LVL3 (GOWN DISPOSABLE) ×2
GOWN STRL REUS W/TWL XL LVL3 (GOWN DISPOSABLE) ×2 IMPLANT
KIT MARKER MARGIN INK (KITS) IMPLANT
NEEDLE HYPO 25X1 1.5 SAFETY (NEEDLE) ×2 IMPLANT
NS IRRIG 1000ML POUR BTL (IV SOLUTION) ×2 IMPLANT
PACK BASIN DAY SURGERY FS (CUSTOM PROCEDURE TRAY) ×2 IMPLANT
PENCIL BUTTON HOLSTER BLD 10FT (ELECTRODE) ×2 IMPLANT
SLEEVE SCD COMPRESS KNEE MED (MISCELLANEOUS) ×2 IMPLANT
SPONGE LAP 4X18 X RAY DECT (DISPOSABLE) ×2 IMPLANT
STAPLER VISISTAT 35W (STAPLE) IMPLANT
SUT MON AB 4-0 PC3 18 (SUTURE) ×2 IMPLANT
SUT SILK 2 0 SH (SUTURE) ×2 IMPLANT
SUT VIC AB 3-0 SH 27 (SUTURE) ×1
SUT VIC AB 3-0 SH 27X BRD (SUTURE) ×1 IMPLANT
SYR CONTROL 10ML LL (SYRINGE) ×2 IMPLANT
TOWEL OR 17X24 6PK STRL BLUE (TOWEL DISPOSABLE) ×4 IMPLANT
TOWEL OR NON WOVEN STRL DISP B (DISPOSABLE) ×2 IMPLANT
TUBE CONNECTING 20X1/4 (TUBING) ×2 IMPLANT
YANKAUER SUCT BULB TIP NO VENT (SUCTIONS) ×2 IMPLANT

## 2013-12-08 NOTE — H&P (View-Only) (Signed)
Patient returns after left breast lumpectomy for DCIS. She had one focal positive margin. She is doing well.  Exam: Left breast incision clean dry and intact without signs of infection.  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 1 of 3 FINAL for Holm, Myrene ([id number]) Diagnosis(continued) - DUCTAL CARCINOMA IN SITU INVOLVES APPROXIMATELY 2.1 CM WORTH OF BREAST TISSUE. - ANTERIOR MARGIN IS FOCALLY POSITIVE FOR DUCTAL CARCINOMA IN SITU. - OTHER MARGINS ARE NEGATIVE. - SEE ONCOLOGY TEMPLATE. Microscopic Comment  Impression: Left breast DCIS with focal positive margin  Plan: Discussed reexcision with the patient today the rationale for doing so. She is set up to that in a couple weeks. She will need medical and radiation oncology afterwards.The procedure has been discussed with the patient. Alternatives to surgery have been discussed with the patient.  Risks of surgery include bleeding,  Infection,  Seroma formation, death,  and the need for further surgery.   The patient understands and wishes to proceed.

## 2013-12-08 NOTE — Transfer of Care (Signed)
Immediate Anesthesia Transfer of Care Note  Patient: Makayla [name]  Procedure(s) Performed: Procedure(s): RE-EXCISION OF BREAST LUMPECTOMY (Left)  Patient Location: PACU  Anesthesia Type:General  Level of Consciousness: awake, oriented and patient cooperative  Airway & Oxygen Therapy: Patient Spontanous Breathing and Patient connected to face mask oxygen  Post-op Assessment: Report given to PACU RN and Post -op Vital signs reviewed and stable  Post vital signs: Reviewed and stable  Complications: No apparent anesthesia complications

## 2013-12-08 NOTE — Anesthesia Postprocedure Evaluation (Signed)
  Anesthesia Post-op Note  Patient: Makayla [name]  Procedure(s) Performed: Procedure(s): RE-EXCISION OF BREAST LUMPECTOMY (Left)  Patient Location: PACU  Anesthesia Type:General  Level of Consciousness: awake, alert  and oriented  Airway and Oxygen Therapy: Patient Spontanous Breathing  Post-op Pain: mild  Post-op Assessment: Post-op Vital signs reviewed  Post-op Vital Signs: Reviewed  Complications: No apparent anesthesia complications

## 2013-12-08 NOTE — Anesthesia Preprocedure Evaluation (Signed)
Anesthesia Evaluation  Patient identified by MRN, date of birth, ID band Patient awake    Reviewed: Allergy & Precautions, H&P , NPO status , Patient's Chart, lab work & pertinent test results  Airway Mallampati: I TM Distance: >3 FB Neck ROM: Full    Dental  (+) Teeth Intact and Dental Advisory Given   Pulmonary  breath sounds clear to auscultation        Cardiovascular Rhythm:Regular Rate:Normal     Neuro/Psych    GI/Hepatic   Endo/Other    Renal/GU      Musculoskeletal   Abdominal   Peds  Hematology   Anesthesia Other Findings   Reproductive/Obstetrics                           Anesthesia Physical Anesthesia Plan  ASA: II  Anesthesia Plan: General   Post-op Pain Management:    Induction: Intravenous  Airway Management Planned: LMA  Additional Equipment:   Intra-op Plan:   Post-operative Plan: Extubation in OR  Informed Consent: I have reviewed the patients History and Physical, chart, labs and discussed the procedure including the risks, benefits and alternatives for the proposed anesthesia with the patient or authorized representative who has indicated his/her understanding and acceptance.   Dental advisory given  Plan Discussed with: CRNA, Anesthesiologist and Surgeon  Anesthesia Plan Comments:         Anesthesia Quick Evaluation

## 2013-12-08 NOTE — Interval H&P Note (Signed)
History and Physical Interval Note:  12/08/2013 2:14 PM  Makayla [name]  has presented today for surgery, with the diagnosis of dcis   The various methods of treatment have been discussed with the patient and family. After consideration of risks, benefits and other options for treatment, the patient has consented to  Procedure(s): RE-EXCISION OF BREAST LUMPECTOMY (Left) as a surgical intervention .  The patient's history has been reviewed, patient examined, no change in status, stable for surgery.  I have reviewed the patient's chart and labs.  Questions were answered to the patient's satisfaction.     Jahlisa Rossitto A.

## 2013-12-08 NOTE — Op Note (Signed)
Breast Re-excison Lumpectomy Procedure Note LEFT  Indications:  This patient returns following an initial lumpectomy.  Analysis of the pathology specimen revealed microscopic involvement of the anterior margins.  The patient now returns for re-excision.The procedure has been discussed with the patient.  Alternative therapies have been discussed with the patient.  Operative risks include bleeding,  Infection,  Organ injury,  Nerve injury,  Blood vessel injury,  DVT,  Pulmonary embolism,  Death,  And possible reoperation.  Medical management risks include worsening of present situation.  The success of the procedure is 50 -90 % at treating patients symptoms.  The patient understands and agrees to proceed.  Pre-operative Diagnosis: left breast DCIS  Post-operative Diagnosis: left breast DCIS  Surgeon: Erroll Luna A.   Assistants: or STAFF  Anesthesia: General LMA anesthesia and Local anesthesia 0.25.% bupivacaine, with epinephrine  ASA Class: 2  Procedure Details  The patient was seen in the Holding Room. The risks, benefits, complications, treatment options, and expected outcomes were discussed with the patient. The possibilities of reaction to medication, pulmonary aspiration, bleeding, infection, the need for additional procedures, failure to diagnose a condition, and creating a complication requiring transfusion or operation were discussed with the patient. The patient concurred with the proposed plan, giving informed consent.  The site of surgery properly noted/marked. The patient was taken to Operating Room # 6, identified as Makayla [name] and the procedure verified as Breast Re-excision Lumpectomy left . A Time Out was held and the above information confirmed.  the patient was placed supine.  The breast was prepped and draped in standard fashion. Bupivicaine 0.25% with epinephrine was used to anesthetize the skin around the previous lumpectomy incision.  The incision was opened.  A   seroma was evacuated.  Additional local anesthesia was delivered anteriorly within the lumpectomy cavity.  A full thickness re-excision was performed.  An orientation suture was placed anteriorly.  The new margin was inked and the specimen was submitted to pathology.  Hemostasis was achieved with cautery.  Closure was performed in 2 layers with a 3-0 Vicryl  And 4 O MONOCRYL subcuticular closure.    Dermabond was applied.  At the end of the operation, all sponge, instrument and needle counts were correct.   Findings: grossly clear surgical margins  Estimated Blood Loss:  Minimal                  Total IV Fluids: 400 ml         Specimens: see above                  Complications:  None; patient tolerated the procedure well.         Disposition: PACU - hemodynamically stable.         Condition: stable  Attending Attestation: I performed the procedure.

## 2013-12-08 NOTE — Anesthesia Procedure Notes (Signed)
Procedure Name: LMA Insertion Date/Time: 12/08/2013 2:44 PM Performed by: Lyndee Leo Pre-anesthesia Checklist: Patient identified, Emergency Drugs available, Suction available and Patient being monitored Patient Re-evaluated:Patient Re-evaluated prior to inductionOxygen Delivery Method: Circle System Utilized Preoxygenation: Pre-oxygenation with 100% oxygen Intubation Type: IV induction Ventilation: Mask ventilation without difficulty LMA: LMA inserted LMA Size: 4.0 Number of attempts: 1 Airway Equipment and Method: bite block Placement Confirmation: positive ETCO2 Tube secured with: Tape Dental Injury: Teeth and Oropharynx as per pre-operative assessment

## 2013-12-08 NOTE — Discharge Instructions (Signed)
[facility] °Office Phone Number [phone] ° °BREAST BIOPSY/ PARTIAL MASTECTOMY: POST OP INSTRUCTIONS ° °Always review your discharge instruction sheet given to you by the facility where your surgery was performed. ° °IF YOU HAVE DISABILITY OR FAMILY LEAVE FORMS, YOU MUST BRING THEM TO THE OFFICE FOR PROCESSING.  DO NOT GIVE THEM TO YOUR DOCTOR. ° °1. A prescription for pain medication may be given to you upon discharge.  Take your pain medication as prescribed, if needed.  If narcotic pain medicine is not needed, then you may take acetaminophen (Tylenol) or ibuprofen (Advil) as needed. °2. Take your usually prescribed medications unless otherwise directed °3. If you need a refill on your pain medication, please contact your pharmacy.  They will contact our office to request authorization.  Prescriptions will not be filled after 5pm or on week-ends. °4. You should eat very light the first 24 hours after surgery, such as soup, crackers, pudding, etc.  Resume your normal diet the day after surgery. °5. Most patients will experience some swelling and bruising in the breast.  Ice packs and a good support bra will help.  Swelling and bruising can take several days to resolve.  °6. It is common to experience some constipation if taking pain medication after surgery.  Increasing fluid intake and taking a stool softener will usually help or prevent this problem from occurring.  A mild laxative (Milk of Magnesia or Miralax) should be taken according to package directions if there are no bowel movements after 48 hours. °7. Unless discharge instructions indicate otherwise, you may remove your bandages 24-48 hours after surgery, and you may shower at that time.  You may have steri-strips (small skin tapes) in place directly over the incision.  These strips should be left on the skin for 7-10 days.  If your surgeon used skin glue on the incision, you may shower in 24 hours.  The glue will flake off over the  next 2-3 weeks.  Any sutures or staples will be removed at the office during your follow-up visit. °8. ACTIVITIES:  You may resume regular daily activities (gradually increasing) beginning the next day.  Wearing a good support bra or sports bra minimizes pain and swelling.  You may have sexual intercourse when it is comfortable. °a. You may drive when you no longer are taking prescription pain medication, you can comfortably wear a seatbelt, and you can safely maneuver your car and apply brakes. °b. RETURN TO WORK:  ______________________________________________________________________________________ °9. You should see your doctor in the office for a follow-up appointment approximately two weeks after your surgery.  Your doctor’s nurse will typically make your follow-up appointment when she calls you with your pathology report.  Expect your pathology report 2-3 business days after your surgery.  You may call to check if you do not hear from us after three days. °10. OTHER INSTRUCTIONS: _______________________________________________________________________________________________ _____________________________________________________________________________________________________________________________________ °_____________________________________________________________________________________________________________________________________ °_____________________________________________________________________________________________________________________________________ ° °WHEN TO CALL YOUR DOCTOR: °1. Fever over 101.0 °2. Nausea and/or vomiting. °3. Extreme swelling or bruising. °4. Continued bleeding from incision. °5. Increased pain, redness, or drainage from the incision. ° °The clinic staff is available to answer your questions during regular business hours.  Please don’t hesitate to call and ask to speak to one of the nurses for clinical concerns.  If you have a medical emergency, go to the nearest  emergency room or call 911.  A surgeon from [facility] is always on call at the hospital. ° °For further questions, please visit [email or web address]  ° ° °  Post Anesthesia Home Care Instructions ° °Activity: °Get plenty of rest for the remainder of the day. A responsible adult should stay with you for 24 hours following the procedure.  °For the next 24 hours, DO NOT: °-Drive a car °-Operate machinery °-Drink alcoholic beverages °-Take any medication unless instructed by your physician °-Make any legal decisions or sign important papers. ° °Meals: °Start with liquid foods such as gelatin or soup. Progress to regular foods as tolerated. Avoid greasy, spicy, heavy foods. If nausea and/or vomiting occur, drink only clear liquids until the nausea and/or vomiting subsides. Call your physician if vomiting continues. ° °Special Instructions/Symptoms: °Your throat may feel dry or sore from the anesthesia or the breathing tube placed in your throat during surgery. If this causes discomfort, gargle with warm salt water. The discomfort should disappear within 24 hours. ° °

## 2013-12-10 ENCOUNTER — Encounter (HOSPITAL_BASED_OUTPATIENT_CLINIC_OR_DEPARTMENT_OTHER): Payer: Self-pay | Admitting: Surgery

## 2013-12-11 ENCOUNTER — Telehealth (INDEPENDENT_AMBULATORY_CARE_PROVIDER_SITE_OTHER): Payer: Self-pay

## 2013-12-11 NOTE — Telephone Encounter (Signed)
Called pt with benign path report.  

## 2013-12-11 NOTE — Telephone Encounter (Signed)
Message copied by Carlene Coria on Fri Dec 11, 2013  2:18 PM ------      Message from: Erroll Luna A      Created: Fri Dec 11, 2013 12:36 PM       Margins clear ------

## 2013-12-15 ENCOUNTER — Telehealth: Payer: Self-pay | Admitting: *Deleted

## 2013-12-15 ENCOUNTER — Ambulatory Visit: Payer: Medicare Other | Admitting: Oncology

## 2013-12-15 ENCOUNTER — Other Ambulatory Visit: Payer: Medicare Other

## 2013-12-15 ENCOUNTER — Ambulatory Visit: Payer: Medicare Other

## 2013-12-15 NOTE — Telephone Encounter (Signed)
Received referral in [place].  Gave paperwork to Dr. Humphrey Rolls for an appt.  Emailed Dr. Brantley Stage to make him aware.

## 2013-12-16 ENCOUNTER — Telehealth: Payer: Self-pay | Admitting: *Deleted

## 2013-12-16 NOTE — Telephone Encounter (Signed)
Pt returned Keisha's call and I confirmed 12/24/13 appt w/ pt.  Unable to schedule Dr. Laurelyn Sickle visit - emailed Audie Clear to enter it.  Emailed Dr. Brantley Stage to make him aware.  Updated chart and spreadsheet.  Placed chart in Dr. Laurelyn Sickle box.

## 2013-12-18 ENCOUNTER — Encounter: Payer: Self-pay | Admitting: Oncology

## 2013-12-18 NOTE — Progress Notes (Signed)
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 - DUCTAL CARCINOMA IN SITU INVOLVES APPROXIMATELY 2.1 CM WORTH OF BREAST TISSUE. - ANTERIOR MARGIN IS FOCALLY POSITIVE FOR DUCTAL CARCINOMA IN SITU. - OTHER MARGINS ARE NEGATIVE. - SEE ONCOLOGY TEMPLATE.  Receptor Status: ER(100% post ive), PR (63% positive), Her2-neu (not done)  Did patient present with symptoms (if so, please note symptoms) or was this found on screening mammography?: screening mammography  Past/Anticipated interventions by surgeon, if any:  11/04/13 Procedure: BREAST LUMPECTOMY WITH NEEDLE LOCALIZATION;  Surgeon: Marcello Moores A. Cornett, MD;  Location: [facility];  Service: General;  Laterality: Left; and 12/08/13 Procedure: BREAST LUMPECTOMY WITH NEEDLE LOCALIZATION;  Surgeon: Marcello Moores A. Cornett, MD;  Location: [place];  Service: General;  Laterality: Left;   Past/Anticipated interventions by medical oncology, if any: Chemotherapy  Apt with Dr. Humphrey Rolls 12/24/13  Lymphedema issues, if any:  no  Pain issues, if any:  Has soreness in left breast from surgery on 12/05/13.  She reports certain movements make it sore.  SAFETY ISSUES:  Prior radiation? no  Pacemaker/ICD? no  Possible current pregnancy?no  Is the patient on methotrexate? no  Current Complaints / other details:  Here with her husband.  Has two sons.  Was 89 at menarche, was 77 years at birth of first child.  Took BCP for 25 years.  Had hysterectomy in 1991.  Took estrogen since then.  Is tapering off the estrogen now.  Takes 1/2 pill for this week and then 1/2 a pill every other day for two weeks.    Jacqulyn Liner, RN 12/18/2013,11:42 AM

## 2013-12-21 ENCOUNTER — Encounter: Payer: Self-pay | Admitting: Radiation Oncology

## 2013-12-21 ENCOUNTER — Telehealth: Payer: Self-pay | Admitting: *Deleted

## 2013-12-21 ENCOUNTER — Ambulatory Visit
Admission: RE | Admit: 2013-12-21 | Discharge: 2013-12-21 | Disposition: A | Discharge status: Home / Self Care | Payer: Medicare Other | Source: Ambulatory Visit | Attending: Radiation Oncology | Admitting: Radiation Oncology

## 2013-12-21 ENCOUNTER — Encounter (INDEPENDENT_AMBULATORY_CARE_PROVIDER_SITE_OTHER): Payer: Self-pay | Admitting: Surgery

## 2013-12-21 ENCOUNTER — Ambulatory Visit (INDEPENDENT_AMBULATORY_CARE_PROVIDER_SITE_OTHER): Payer: Medicare Other | Admitting: Surgery

## 2013-12-21 VITALS — BP 172/75 | HR 91 | Temp 97.4°F | Ht 66.0 in | Wt 160.0 lb

## 2013-12-21 VITALS — BP 130/84 | HR 72 | Temp 98.2°F | Resp 14 | Ht 66.0 in | Wt 159.8 lb

## 2013-12-21 DIAGNOSIS — Z170 Estrogen receptor positive status [ER+]: Secondary | ICD-10-CM | POA: Insufficient documentation

## 2013-12-21 DIAGNOSIS — D0590 Unspecified type of carcinoma in situ of unspecified breast: Secondary | ICD-10-CM | POA: Insufficient documentation

## 2013-12-21 DIAGNOSIS — E785 Hyperlipidemia, unspecified: Secondary | ICD-10-CM | POA: Insufficient documentation

## 2013-12-21 DIAGNOSIS — Z9889 Other specified postprocedural states: Secondary | ICD-10-CM

## 2013-12-21 DIAGNOSIS — Z79899 Other long term (current) drug therapy: Secondary | ICD-10-CM | POA: Diagnosis not present

## 2013-12-21 DIAGNOSIS — C50419 Malignant neoplasm of upper-outer quadrant of unspecified female breast: Secondary | ICD-10-CM

## 2013-12-21 DIAGNOSIS — C50412 Malignant neoplasm of upper-outer quadrant of left female breast: Secondary | ICD-10-CM

## 2013-12-21 DIAGNOSIS — Z7982 Long term (current) use of aspirin: Secondary | ICD-10-CM | POA: Diagnosis not present

## 2013-12-21 DIAGNOSIS — Z803 Family history of malignant neoplasm of breast: Secondary | ICD-10-CM | POA: Diagnosis not present

## 2013-12-21 NOTE — Telephone Encounter (Signed)
R/s pt for appt with Dr. Humphrey Rolls.  Confirmed new appt on 12/30/13 at 3:00/3:30.  Pt denies further needs at this time.

## 2013-12-21 NOTE — Progress Notes (Signed)
Please see the Nurse Progress Note in the MD Initial Consult Encounter for this patient. 

## 2013-12-21 NOTE — Patient Instructions (Signed)
Return 3 months

## 2013-12-21 NOTE — Progress Notes (Signed)
Radiation Oncology         [phone]) [phone] ________________________________  Initial outpatient Consultation  Name: Makayla [name] MRN: [id number]  Date: 12/21/2013  DOB: June 26, 1934  CC:No primary provider on file.  Cornett, Joyice Faster., MD   REFERRING PHYSICIAN: Cornett, Joyice Faster., MD  DIAGNOSIS: Intraductal carcinoma of the left breast,  Tis, NX, MX   HISTORY OF PRESENT ILLNESS::Makayla [name] is a 80 y.o. female who is seen out of the courtesy of Dr. Erroll Luna for an opinion concerning radiation therapy as part of management of patient's recently diagnosed left breast cancer. Patient presented recently with abnormal calcifications within the upper-outer quadrant of the left breast.. This area could not be reached by stereotactic biopsy and the patient proceeded to undergo left breast lumpectomy with wire localization.  pathology from this procedure revealed intermediate grade ductal carcinoma in situ with comedo necrosis and calcification. The tumor involved approximately 2.1 cm. The anterior margin was focally positive. The tumor was estrogen receptor positive at 100% and progesterone receptor positive at 96%. She was taken back to the operating room for reexcision in light of the positive anterior margin. The re-excision specimen showed no residual tumor. Patient is been doing well since her surgery. She is now seen in radiation oncology for consultation and consideration for treatment.Marland Kitchen  PREVIOUS RADIATION THERAPY: No  PAST MEDICAL HISTORY:  has a past medical history of Hyperlipidemia; Wears glasses; and Breast cancer.    PAST SURGICAL HISTORY: Past Surgical History  Procedure Laterality Date  . Vaginal hysterectomy  1991    BSO  . Tonsillectomy    . Breast lumpectomy with needle localization Left 11/04/2013    Procedure: BREAST LUMPECTOMY WITH NEEDLE LOCALIZATION;  Surgeon: Marcello Moores A. Cornett, MD;  Location: [facility];  Service: General;  Laterality: Left;  .  Re-excision of breast lumpectomy Left 12/08/2013    Procedure: BREAST LUMPECTOMY WITH NEEDLE LOCALIZATION;  Surgeon: Marcello Moores A. Cornett, MD;  Location: [facility];  Service: General;  Laterality: Left;    FAMILY HISTORY: family history includes Breast cancer in her sister.  SOCIAL HISTORY:  reports that she has never smoked. She does not have any smokeless tobacco history on file. She reports that she does not drink alcohol or use illicit drugs. she is married and lives in the [place], [place] area.  ALLERGIES: Codeine and Sulfa antibiotics  MEDICATIONS:  Current Outpatient Prescriptions  Medication Sig Dispense Refill  . aspirin 81 MG tablet Take 81 mg by mouth daily.      Marland Kitchen atorvastatin (LIPITOR) 10 MG tablet Take 10 mg by mouth daily.      . Calcium-Vitamin D-Vitamin K (VIACTIV PO) Take 1,000 mg by mouth daily.      Marland Kitchen loratadine (CLARITIN) 10 MG tablet Take 10 mg by mouth daily.      . Multiple Vitamin (MULTIVITAMIN) tablet Take 1 tablet by mouth daily.      . Omega-3 Fatty Acids (FISH OIL) 1000 MG CAPS Take by mouth daily.      Marland Kitchen estrogen-methylTESTOSTERone 0.625-1.25 MG per tablet Take 1 tablet by mouth daily.      Marland Kitchen HYDROcodone-acetaminophen (NORCO) 5-325 MG per tablet Take 1 tablet by mouth every 6 (six) hours as needed for moderate pain.  30 tablet  0   No current facility-administered medications for this encounter.    REVIEW OF SYSTEMS:  A 15 point review of systems is documented in the electronic medical record. This was obtained by the nursing staff. However, I  reviewed this with the patient to discuss relevant findings and make appropriate changes.  She has noticed mild discomfort in the upper-outer aspect of the left breast. She denies any nipple discharge or bleeding. She denies any swelling in her left arm since her surgery. Prior to diagnosis the patient denied any breast symptoms. She denies any new bony pain headaches dizziness or blurred vision.     PHYSICAL EXAM:  height is 5\' 6"  (1.676 m) and weight is 160 lb (72.576 kg). Her temperature is 97.4 F (36.3 C). Her blood pressure is 172/75 and her pulse is 91.   BP 172/75  Pulse 91  Temp(Src) 97.4 F (36.3 C)  Ht 5\' 6"  (1.676 m)  Wt 160 lb (72.576 kg)  BMI 25.84 kg/m2  General Appearance:    Alert, cooperative, no distress, appears stated age, accompanied by her husband on evaluation today   Head:    Normocephalic, without obvious abnormality, atraumatic  Eyes:    PERRL, conjunctiva/corneas clear, EOM's intact,         Nose:   Nares normal, septum midline, mucosa normal, no drainage    or sinus tenderness  Throat:   Lips, mucosa, and tongue normal; teeth and gums normal  Neck:   Supple, symmetrical, trachea midline, no adenopathy;    thyroid:  no enlargement/tenderness/nodules; no carotid   bruit or JVD  Back:     Symmetric, no curvature, ROM normal, no CVA tenderness  Lungs:     Clear to auscultation bilaterally, respirations unlabored  Chest Wall:    No tenderness or deformity   Heart:    Regular rate and rhythm, S1 and S2 normal, no murmur, rub   or gallop  Breast Exam:    Right breast no tenderness, masses, or nipple abnormality.  The left breast shows a well healing scar in the upper outer quadrant from her lumpectomy and reexcision. No signs of infection within the breast, nipple discharge or bleeding   Abdomen:     Soft, non-tender, bowel sounds active all four quadrants,    no masses, no organomegaly        Extremities:   Extremities normal, atraumatic, no cyanosis or edema  Pulses:   2+ and symmetric all extremities  Skin:   Skin color, texture, turgor normal, no rashes or lesions  Lymph nodes:   Cervical, supraclavicular, and axillary nodes normal  Neurologic:    normal strength, sensation and reflexes    throughout    ECOG = 0  0 - Asymptomatic (Fully active, able to carry on all predisease activities without restriction)  LABORATORY DATA:  Lab Results   Component Value Date   HGB 14.4 12/08/2013   No results found for this basename: NA, K, CL, CO2   No results found for this basename: ALT, AST, GGT, ALKPHOS, BILITOT     RADIOGRAPHY:     IMPRESSION:  Intraductal carcinoma of the left breast.  Patient would be a good candidate for breast conservation therapy.  Options to consider would be radiation therapy as part of her breast conserving treatment or hormonal therapy.  In light of the patient's age and pathologic findings I do not feel she would require both radiation and hormonal therapy as part of her post operative management.  PLAN: The patient will meet with medical oncology in the near future and then she will make her decision concerning postoperative treatment. I've asked the patient to call me or medical oncology once she has made her determination concerning postoperative management.  I spent 60 minutes minutes face to face with the patient and more than 50% of that time was spent in counseling and/or coordination of care.   ------------------------------------------------  -----------------------------------  Blair Promise, PhD, MD

## 2013-12-21 NOTE — Progress Notes (Signed)
Patient returns after reexcision left breast lumpectomy for DCIS. She is doing well. Final margins clear. She saw radiation oncology dysphoric to see medical oncology early March.  Exam: Left breast incision clean dry and intact without signs of significant seroma, redness or drainage.  Impression: Left breast DCIS status post lumpectomy reexcision margins now negative   Plan: Await medical radiation oncology recommendations. Return 3 months.

## 2013-12-22 ENCOUNTER — Telehealth: Payer: Self-pay | Admitting: *Deleted

## 2013-12-22 NOTE — Telephone Encounter (Signed)
Mailed before appt letter, welcome packet & Intake form to pt. Took paperwork to HIM for a chart.

## 2013-12-24 ENCOUNTER — Ambulatory Visit: Payer: Medicare Other | Admitting: Oncology

## 2013-12-24 ENCOUNTER — Ambulatory Visit: Payer: Medicare Other

## 2013-12-24 ENCOUNTER — Other Ambulatory Visit: Payer: Medicare Other

## 2013-12-28 ENCOUNTER — Ambulatory Visit: Payer: Medicare Other | Admitting: Oncology

## 2013-12-30 ENCOUNTER — Other Ambulatory Visit (HOSPITAL_BASED_OUTPATIENT_CLINIC_OR_DEPARTMENT_OTHER): Payer: Medicare Other

## 2013-12-30 ENCOUNTER — Ambulatory Visit (HOSPITAL_BASED_OUTPATIENT_CLINIC_OR_DEPARTMENT_OTHER): Payer: Medicare Other | Admitting: Oncology

## 2013-12-30 ENCOUNTER — Encounter: Payer: Self-pay | Admitting: Oncology

## 2013-12-30 ENCOUNTER — Encounter: Payer: Self-pay | Admitting: *Deleted

## 2013-12-30 ENCOUNTER — Ambulatory Visit: Payer: Medicare Other

## 2013-12-30 VITALS — BP 161/83 | HR 76 | Temp 98.1°F | Resp 18 | Ht 66.0 in | Wt 157.3 lb

## 2013-12-30 DIAGNOSIS — Z170 Estrogen receptor positive status [ER+]: Secondary | ICD-10-CM | POA: Diagnosis not present

## 2013-12-30 DIAGNOSIS — C50419 Malignant neoplasm of upper-outer quadrant of unspecified female breast: Secondary | ICD-10-CM

## 2013-12-30 DIAGNOSIS — D0590 Unspecified type of carcinoma in situ of unspecified breast: Secondary | ICD-10-CM | POA: Diagnosis not present

## 2013-12-30 DIAGNOSIS — C50412 Malignant neoplasm of upper-outer quadrant of left female breast: Secondary | ICD-10-CM

## 2013-12-30 LAB — CBC WITH DIFFERENTIAL/PLATELET
BASO%: 0.5 % (ref 0.0–2.0)
Basophils Absolute: 0.1 10*3/uL (ref 0.0–0.1)
EOS%: 2.3 % (ref 0.0–7.0)
Eosinophils Absolute: 0.2 10*3/uL (ref 0.0–0.5)
HCT: 43.4 % (ref 34.8–46.6)
HEMOGLOBIN: 14.5 g/dL (ref 11.6–15.9)
LYMPH#: 2.7 10*3/uL (ref 0.9–3.3)
LYMPH%: 28.3 % (ref 14.0–49.7)
MCH: 30.9 pg (ref 25.1–34.0)
MCHC: 33.3 g/dL (ref 31.5–36.0)
MCV: 92.7 fL (ref 79.5–101.0)
MONO#: 0.8 10*3/uL (ref 0.1–0.9)
MONO%: 8.7 % (ref 0.0–14.0)
NEUT#: 5.7 10*3/uL (ref 1.5–6.5)
NEUT%: 60.2 % (ref 38.4–76.8)
Platelets: 395 10*3/uL (ref 145–400)
RBC: 4.68 10*6/uL (ref 3.70–5.45)
RDW: 13.1 % (ref 11.2–14.5)
WBC: 9.5 10*3/uL (ref 3.9–10.3)

## 2013-12-30 LAB — COMPREHENSIVE METABOLIC PANEL (CC13)
ALBUMIN: 4.1 g/dL (ref 3.5–5.0)
ALT: 16 U/L (ref 0–55)
ANION GAP: 7 meq/L (ref 3–11)
AST: 19 U/L (ref 5–34)
Alkaline Phosphatase: 84 U/L (ref 40–150)
BUN: 19.2 mg/dL (ref 7.0–26.0)
CHLORIDE: 105 meq/L (ref 98–109)
CO2: 30 meq/L — AB (ref 22–29)
CREATININE: 0.9 mg/dL (ref 0.6–1.1)
Calcium: 10.4 mg/dL (ref 8.4–10.4)
Glucose: 69 mg/dl — ABNORMAL LOW (ref 70–140)
POTASSIUM: 4.4 meq/L (ref 3.5–5.1)
Sodium: 142 mEq/L (ref 136–145)
TOTAL PROTEIN: 7.6 g/dL (ref 6.4–8.3)
Total Bilirubin: 0.42 mg/dL (ref 0.20–1.20)

## 2013-12-30 NOTE — Progress Notes (Signed)
Completed chart, labs ordered, added to spreadsheet & placed in Dr. Laurelyn Sickle box.

## 2013-12-30 NOTE — Progress Notes (Signed)
Makayla [name] [id number] [birth year] 80 y.o. 12/30/2013 3:38 PM  CC Dr. Pat Kocher Dr. Jamey Reas Dr. Erroll Luna Dr. Gery Pray  REASON FOR CONSULTATION:  80 year old female with new diagnosis of DCIS. Patient is seen in medical oncology for discussion of treatment options.  STAGE:    Pathologic: Stage 0 (T0, NX, cM0) signed by Deatra Robinson, MD on 01/03/2014  5:02 PM   Summary: Stage 0 (T0, NX, cM0)   REFERRING PHYSICIAN: Dr. Marcello Moores Cornett  HISTORY OF PRESENT ILLNESS:  Makayla [name] is a 80 y.o. female.  Had a mammogram performed which was abnormal. She was noted to have abnormal calcifications within the upper-outer quadrant of the left breast. Stereotactic biopsy was attempted but could not be done. Therefore patient was seen by Dr. Erroll Luna and she underwent a left breast lumpectomy with a wire localization. The final pathology revealed intermediate grade ductal carcinoma in situ with comedonecrosis and calcifications. The area was measured at 2.1 cm. The anterior margin was initially focally positive she had a reexcision that showed no residual tumor. The tumor was estrogen receptor +100% progesterone receptor +96%. Postoperatively patient has done well. She was seen by Dr. Gery Pray in radiation oncology. He did recommend radiation therapy adjuvantly. Patient is now seen in medical oncology for discussion of adjuvant antiestrogen therapy treatment. She is without any complaints. 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 She lives in [place].   Past Medical History: Past Medical History  Diagnosis Date  . Hyperlipidemia   . Wears glasses   . Breast cancer     left    Past Surgical History: Past Surgical History  Procedure Laterality Date  . Vaginal hysterectomy  1991    BSO  . Tonsillectomy    . Breast lumpectomy with needle localization Left 11/04/2013    Procedure: BREAST LUMPECTOMY WITH NEEDLE LOCALIZATION;   Surgeon: Marcello Moores A. Cornett, MD;  Location: [facility];  Service: General;  Laterality: Left;  . Re-excision of breast lumpectomy Left 12/08/2013    Procedure: BREAST LUMPECTOMY WITH NEEDLE LOCALIZATION;  Surgeon: Marcello Moores A. Cornett, MD;  Location: [facility];  Service: General;  Laterality: Left;    Family History: Family History  Problem Relation Age of Onset  . Breast cancer Sister     Social Histor History  Substance Use Topics  . Smoking status: Never Smoker   . Smokeless tobacco: Not on file  . Alcohol Use: No     Comment: occasional wine    Allergies: Allergies  Allergen Reactions  . Codeine Nausea And Vomiting  . Sulfa Antibiotics Nausea And Vomiting    Current Medications: Current Outpatient Prescriptions  Medication Sig Dispense Refill  . aspirin 81 MG tablet Take 81 mg by mouth daily.      Marland Kitchen atorvastatin (LIPITOR) 10 MG tablet Take 10 mg by mouth daily.      . Calcium-Vitamin D-Vitamin K (VIACTIV PO) Take 1,000 mg by mouth daily.      Marland Kitchen estrogen-methylTESTOSTERone 0.625-1.25 MG per tablet Take 1 tablet by mouth daily.      Marland Kitchen loratadine (CLARITIN) 10 MG tablet Take 10 mg by mouth daily.      . Multiple Vitamin (MULTIVITAMIN) tablet Take 1 tablet by mouth daily.      . Omega-3 Fatty Acids (FISH OIL) 1000 MG CAPS Take by mouth daily.       No current facility-administered medications for this visit.    OB/GYN  History: menarche at 67, menopause in 67, HRT now weaning off, G2P2 age first live 58  Fertility Discussion: n/a Prior History of Cancer: no  Health Maintenance:  Colonoscopy no Bone Density yes Last PAP smear 2011 or 2012  ECOG PERFORMANCE STATUS: 0 - Asymptomatic  Genetic Counseling/testing: no  REVIEW OF SYSTEMS:  A comprehensive review of systems was negative.  PHYSICAL EXAMINATION: Blood pressure 161/83, pulse 76, temperature 98.1 F (36.7 C), temperature source Oral, resp. rate 18, height 5\' 6"  (1.676 m),  weight 157 lb 4.8 oz (71.351 kg).  General:  well-nourished in no acute distress.  Eyes:  no scleral icterus.  ENT:  There were no oropharyngeal lesions.  Neck was without thyromegaly.  Lymphatics:  Negative cervical, supraclavicular or axillary adenopathy.  Respiratory: lungs were clear bilaterally without wheezing or crackles.  Cardiovascular:  Regular rate and rhythm, S1/S2, without murmur, rub or gallop.  There was no pedal edema.  GI:  abdomen was soft, flat, nontender, nondistended, without organomegaly.  Muscoloskeletal:  no spinal tenderness of palpation of vertebral spine.  Skin exam was without echymosis, petichae.  Neuro exam was nonfocal.  Patient was able to get on and off exam table without assistance.  Gait was normal.  Patient was alerted and oriented.  Attention was good.   Language was appropriate.  Mood was normal without depression.  Speech was not pressured.  Thought content was not tangential.   Breasts: right breast normal without mass, skin or nipple changes or axillary nodes, left breast normal without mass, skin or nipple changes or axillary nodes, surgical scars noted in the left breast well-healed..   STUDIES/RESULTS: No results found.   LABS:    Chemistry   No results found for this basename: NA, K, CL, CO2, BUN, CREATININE, GLU   No results found for this basename: CALCIUM, ALKPHOS, AST, ALT, BILITOT      Lab Results  Component Value Date   WBC 9.5 12/30/2013   HGB 14.5 12/30/2013   HCT 43.4 12/30/2013   MCV 92.7 12/30/2013   PLT 395 12/30/2013   PATHOLOGY: 11/04/13 PROGNOSTIC INDICATORS - ACIS Results: IMMUNOHISTOCHEMICAL AND MORPHOMETRIC ANALYSIS BY THE AUTOMATED CELLULAR IMAGING SYSTEM (ACIS) Estrogen Receptor: 100%, POSITIVE, STRONG STAINING INTENSITY Progesterone Receptor: 75%, POSITIVE, STRONG STAINING INTENSITY REFERENCE RANGE ESTROGEN RECEPTOR NEGATIVE <1% POSITIVE =>1% PROGESTERONE RECEPTOR NEGATIVE <1% POSITIVE =>1% All controls stained  appropriately Claudette Laws MD Pathologist, Electronic Signature ( Signed 12/25/2013) FINAL DIAGNOSIS Diagnosis Breast, right, needle core biopsy, lateral - DUCTAL CARCINOMA IN SITU WITH CALCIFICATIONS, SEE COMMENT. - LOBULAR NEOPLASIA (ATYPICAL LOBULAR HYPERPLASIA). Microscopic Comment Although the grade of tumor is best assessed at resection , in these biopsies, the in situ carcinoma is grade I. 1 of 2 FINAL for COX, AMANDA G ([id number]) Microscopic Comment(continued) Breast prognostic studies are pending and  ASSESSMENT/PLAN    80 year old female with  #1 stage 0 (Tis NX) ductal carcinoma in situ with comedonecrosis measuring 2.1 cm. Tumor is ER positive PR positive. Patient is status post lumpectomy on 11/04/2013. Postoperatively she is doing well.    2. We spent the better part of today's hour-long appointment discussing the biology of breast cancer in general, and the specifics of the patient's tumor in particular. We discussed the multidisciplinary approach to breast cancer treatment. We went over her pathology. She understands that her cancer is a non invasive disease. She is s/p surgery,consisting of lumpectomy.she has been seen by  Radiation and will begin RT next week. We discussed the role of adjuvant  anti-estrogen therapy to help prevent future ER+ breast cancer in the ipsilateral and contralateral breasts. We discussed the different drugs available including tamoxifen. Which would be 20 mg daily for a total of 5 years. We also discussed aromasin 25 mg daily for 5 years. We discussed the side effects of both medications.she will not begin anti-estrogen therapy until after completion of radiation therapy  3. Patient will proceed with radiation therapy and I will see her back in 2  months in follow up. And at that time we will begin the anti-estrogen therapy   Patient is being treated per NCCN breast cancer care guidelines appropriate for stage.0   Thank you so much for  allowing me to participate in the care of Makayla [name]. I will continue to follow up the patient with you and assist in her care.  All questions were answered. The patient knows to call the clinic with any problems, questions or concerns. We can certainly see the patient much sooner if necessary.  I spent 55 minutes counseling the patient face to face. The total time spent in the appointment was 60 minutes.  Marcy Panning, MD Medical/Oncology [facility] [phone] (beeper) [phone] (Office)  12/30/2013, 3:38 PM

## 2013-12-30 NOTE — Progress Notes (Signed)
Patient advised Dr. Alferd Apa is her PCP.

## 2013-12-30 NOTE — Progress Notes (Signed)
Checked in new patient with no financial issues. She has appt card and [facility] packet.  She has indicated medicare has her birthdate as 2034-04-27--just in case we have problems. Her bdate is [birth year].

## 2013-12-30 NOTE — Patient Instructions (Signed)
Tamoxifen  Aromasin (exemestane)  Femara (letrozole)  Arimidex (anastrozole)

## 2013-12-31 ENCOUNTER — Telehealth: Payer: Self-pay | Admitting: Oncology

## 2013-12-31 NOTE — Telephone Encounter (Signed)
lvm for pt regarding to May appt....mailed pt appt sched avs and letter °

## 2014-01-05 ENCOUNTER — Ambulatory Visit
Admission: RE | Admit: 2014-01-05 | Discharge: 2014-01-05 | Disposition: A | Discharge status: Home / Self Care | Payer: Medicare Other | Source: Ambulatory Visit | Attending: Radiation Oncology | Admitting: Radiation Oncology

## 2014-01-05 ENCOUNTER — Ambulatory Visit: Admission: RE | Admit: 2014-01-05 | Payer: Medicare Other | Source: Ambulatory Visit

## 2014-01-05 DIAGNOSIS — D0590 Unspecified type of carcinoma in situ of unspecified breast: Secondary | ICD-10-CM | POA: Diagnosis not present

## 2014-01-05 DIAGNOSIS — L589 Radiodermatitis, unspecified: Secondary | ICD-10-CM | POA: Insufficient documentation

## 2014-01-05 DIAGNOSIS — Z510 Encounter for antineoplastic radiation therapy: Secondary | ICD-10-CM | POA: Insufficient documentation

## 2014-01-05 DIAGNOSIS — Y9210 Unspecified residential institution as the place of occurrence of the external cause: Secondary | ICD-10-CM | POA: Insufficient documentation

## 2014-01-05 DIAGNOSIS — C50419 Malignant neoplasm of upper-outer quadrant of unspecified female breast: Secondary | ICD-10-CM | POA: Diagnosis not present

## 2014-01-05 DIAGNOSIS — Y842 Radiological procedure and radiotherapy as the cause of abnormal reaction of the patient, or of later complication, without mention of misadventure at the time of the procedure: Secondary | ICD-10-CM | POA: Insufficient documentation

## 2014-01-05 DIAGNOSIS — L299 Pruritus, unspecified: Secondary | ICD-10-CM | POA: Insufficient documentation

## 2014-01-05 DIAGNOSIS — R920 Mammographic microcalcification found on diagnostic imaging of breast: Secondary | ICD-10-CM

## 2014-01-07 NOTE — Progress Notes (Signed)
  Radiation Oncology         [phone]) [phone] ________________________________  Name: Makayla [name] MRN: [id number]  Date: 01/05/2014  DOB: 1934-02-29  Optical Surface Tracking Plan:  Since intensity modulated radiotherapy (IMRT) and 3D conformal radiation treatment methods are predicated on accurate and precise positioning for treatment, intrafraction motion monitoring is medically necessary to ensure accurate and safe treatment delivery.  The ability to quantify intrafraction motion without excessive ionizing radiation dose can only be performed with optical surface tracking. Accordingly, surface imaging offers the opportunity to obtain 3D measurements of patient position throughout IMRT and 3D treatments without excessive radiation exposure.  I am ordering optical surface tracking for this patient's upcoming course of radiotherapy. ________________________________  Blair Promise, MD 01/07/2014 9:00 PM    Reference:   Ursula Alert, J, et al. Surface imaging-based analysis of intrafraction motion for breast radiotherapy patients.Journal of [place], n. 6, nov. 2014. ISSN [id number].   Available at: <http://www.jacmp.org/index.php/jacmp/article/view/4957>.

## 2014-01-07 NOTE — Progress Notes (Signed)
  Radiation Oncology         [phone]) [phone] ________________________________  Name: Makayla [name] MRN: [id number]  Date: 01/05/2014  DOB: 04-05-1934  RESPIRATORY MOTION MANAGEMENT SIMULATION  NARRATIVE:  In order to account for effect of respiratory motion on target structures and other organs in the planning and delivery of radiotherapy, this patient underwent respiratory motion management simulation.  To accomplish this, when the patient was brought to the CT simulation planning suite, 4D respiratoy motion management CT images were obtained.  The CT images were loaded into the planning software.  Then, using a variety of tools including Cine, MIP, and standard views, the target volume and planning target volumes (PTV) were delineated.  Avoidance structures were contoured.  Treatment planning then occurred.  Dose volume histograms were generated and reviewed for each of the requested structure.  The resulting plan was carefully reviewed and approved today.  -----------------------------------  Blair Promise, PhD, MD

## 2014-01-07 NOTE — Progress Notes (Signed)
  Radiation Oncology         [phone]) [phone] ________________________________  Name: Makayla [name] MRN: [id number]  Date: 01/05/2014  DOB: [birth year]  SIMULATION AND TREATMENT PLANNING NOTE  DIAGNOSIS:  Intraductal carcinoma of the left breast  NARRATIVE:  The patient was brought to the [facility].  Identity was confirmed.  All relevant records and images related to the planned course of therapy were reviewed.  The patient freely provided informed written consent to proceed with treatment after reviewing the details related to the planned course of therapy. The consent form was witnessed and verified by the simulation staff.  Then, the patient was set-up in a stable reproducible  supine position for radiation therapy.  CT images were obtained.  Surface markings were placed.  The CT images were loaded into the planning software.  Then the target and avoidance structures were contoured.  Treatment planning then occurred.  The radiation prescription was entered and confirmed.  Then, I designed and supervised the construction of a total of 3 medically necessary complex treatment devices.  I have requested : 3D Simulation  I have requested a DVH of the following structures: lumpectomy cavity, lungs, heart.  I have ordered:dose calc.  PLAN:  The patient will receive 42.72 Gy in 16 fractions.  ________________________________  -----------------------------------  Blair Promise, PhD, MD

## 2014-01-11 DIAGNOSIS — Z510 Encounter for antineoplastic radiation therapy: Secondary | ICD-10-CM | POA: Diagnosis not present

## 2014-01-11 DIAGNOSIS — L589 Radiodermatitis, unspecified: Secondary | ICD-10-CM | POA: Diagnosis not present

## 2014-01-11 DIAGNOSIS — L299 Pruritus, unspecified: Secondary | ICD-10-CM | POA: Diagnosis not present

## 2014-01-11 DIAGNOSIS — C50419 Malignant neoplasm of upper-outer quadrant of unspecified female breast: Secondary | ICD-10-CM | POA: Diagnosis not present

## 2014-01-11 DIAGNOSIS — D0590 Unspecified type of carcinoma in situ of unspecified breast: Secondary | ICD-10-CM | POA: Diagnosis not present

## 2014-01-12 ENCOUNTER — Ambulatory Visit
Admission: RE | Admit: 2014-01-12 | Discharge: 2014-01-12 | Disposition: A | Discharge status: Home / Self Care | Payer: Medicare Other | Source: Ambulatory Visit | Attending: Radiation Oncology | Admitting: Radiation Oncology

## 2014-01-12 DIAGNOSIS — Z510 Encounter for antineoplastic radiation therapy: Secondary | ICD-10-CM | POA: Diagnosis not present

## 2014-01-12 DIAGNOSIS — C50412 Malignant neoplasm of upper-outer quadrant of left female breast: Secondary | ICD-10-CM

## 2014-01-12 DIAGNOSIS — L589 Radiodermatitis, unspecified: Secondary | ICD-10-CM | POA: Diagnosis not present

## 2014-01-12 DIAGNOSIS — D0590 Unspecified type of carcinoma in situ of unspecified breast: Secondary | ICD-10-CM | POA: Diagnosis not present

## 2014-01-12 DIAGNOSIS — L299 Pruritus, unspecified: Secondary | ICD-10-CM | POA: Diagnosis not present

## 2014-01-12 DIAGNOSIS — C50419 Malignant neoplasm of upper-outer quadrant of unspecified female breast: Secondary | ICD-10-CM | POA: Diagnosis not present

## 2014-01-12 NOTE — Progress Notes (Signed)
  Radiation Oncology         [phone]) [phone] ________________________________  Name: Makayla [name] MRN: [id number]  Date: 01/12/2014  DOB: [birth year]  Simulation Verification Note  Status: outpatient  NARRATIVE: The patient was brought to the treatment unit and placed in the planned treatment position. The clinical setup was verified. Then port films were obtained and uploaded to the radiation oncology medical record software.  The treatment beams were carefully compared against the planned radiation fields. The position location and shape of the radiation fields was reviewed. They targeted volume of tissue appears to be appropriately covered by the radiation beams. Organs at risk appear to be excluded as planned.  Based on my personal review, I approved the simulation verification. The patient's treatment will proceed as planned.  -----------------------------------  Blair Promise, PhD, MD

## 2014-01-13 ENCOUNTER — Ambulatory Visit
Admission: RE | Admit: 2014-01-13 | Discharge: 2014-01-13 | Disposition: A | Discharge status: Home / Self Care | Payer: Medicare Other | Source: Ambulatory Visit | Attending: Radiation Oncology | Admitting: Radiation Oncology

## 2014-01-13 DIAGNOSIS — Z510 Encounter for antineoplastic radiation therapy: Secondary | ICD-10-CM | POA: Diagnosis not present

## 2014-01-13 DIAGNOSIS — D0590 Unspecified type of carcinoma in situ of unspecified breast: Secondary | ICD-10-CM | POA: Diagnosis not present

## 2014-01-13 DIAGNOSIS — L299 Pruritus, unspecified: Secondary | ICD-10-CM | POA: Diagnosis not present

## 2014-01-13 DIAGNOSIS — L589 Radiodermatitis, unspecified: Secondary | ICD-10-CM | POA: Diagnosis not present

## 2014-01-13 DIAGNOSIS — C50419 Malignant neoplasm of upper-outer quadrant of unspecified female breast: Secondary | ICD-10-CM | POA: Diagnosis not present

## 2014-01-14 ENCOUNTER — Ambulatory Visit
Admission: RE | Admit: 2014-01-14 | Discharge: 2014-01-14 | Disposition: A | Discharge status: Home / Self Care | Payer: Medicare Other | Source: Ambulatory Visit | Attending: Radiation Oncology | Admitting: Radiation Oncology

## 2014-01-14 DIAGNOSIS — D0590 Unspecified type of carcinoma in situ of unspecified breast: Secondary | ICD-10-CM | POA: Diagnosis not present

## 2014-01-14 DIAGNOSIS — L589 Radiodermatitis, unspecified: Secondary | ICD-10-CM | POA: Diagnosis not present

## 2014-01-14 DIAGNOSIS — Z510 Encounter for antineoplastic radiation therapy: Secondary | ICD-10-CM | POA: Diagnosis not present

## 2014-01-14 DIAGNOSIS — L299 Pruritus, unspecified: Secondary | ICD-10-CM | POA: Diagnosis not present

## 2014-01-14 DIAGNOSIS — C50419 Malignant neoplasm of upper-outer quadrant of unspecified female breast: Secondary | ICD-10-CM | POA: Diagnosis not present

## 2014-01-15 ENCOUNTER — Ambulatory Visit
Admission: RE | Admit: 2014-01-15 | Discharge: 2014-01-15 | Disposition: A | Discharge status: Home / Self Care | Payer: Medicare Other | Source: Ambulatory Visit | Attending: Radiation Oncology | Admitting: Radiation Oncology

## 2014-01-15 DIAGNOSIS — L299 Pruritus, unspecified: Secondary | ICD-10-CM | POA: Diagnosis not present

## 2014-01-15 DIAGNOSIS — Z510 Encounter for antineoplastic radiation therapy: Secondary | ICD-10-CM | POA: Diagnosis not present

## 2014-01-15 DIAGNOSIS — L589 Radiodermatitis, unspecified: Secondary | ICD-10-CM | POA: Diagnosis not present

## 2014-01-15 DIAGNOSIS — D0590 Unspecified type of carcinoma in situ of unspecified breast: Secondary | ICD-10-CM | POA: Diagnosis not present

## 2014-01-15 DIAGNOSIS — C50419 Malignant neoplasm of upper-outer quadrant of unspecified female breast: Secondary | ICD-10-CM | POA: Diagnosis not present

## 2014-01-18 ENCOUNTER — Ambulatory Visit
Admission: RE | Admit: 2014-01-18 | Discharge: 2014-01-18 | Disposition: A | Discharge status: Home / Self Care | Payer: Medicare Other | Source: Ambulatory Visit | Attending: Radiation Oncology | Admitting: Radiation Oncology

## 2014-01-18 DIAGNOSIS — Z510 Encounter for antineoplastic radiation therapy: Secondary | ICD-10-CM | POA: Diagnosis not present

## 2014-01-18 DIAGNOSIS — R920 Mammographic microcalcification found on diagnostic imaging of breast: Secondary | ICD-10-CM

## 2014-01-18 DIAGNOSIS — D0590 Unspecified type of carcinoma in situ of unspecified breast: Secondary | ICD-10-CM | POA: Diagnosis not present

## 2014-01-18 DIAGNOSIS — C50419 Malignant neoplasm of upper-outer quadrant of unspecified female breast: Secondary | ICD-10-CM | POA: Diagnosis not present

## 2014-01-18 DIAGNOSIS — L589 Radiodermatitis, unspecified: Secondary | ICD-10-CM | POA: Diagnosis not present

## 2014-01-18 DIAGNOSIS — L299 Pruritus, unspecified: Secondary | ICD-10-CM | POA: Diagnosis not present

## 2014-01-18 MED ORDER — RADIAPLEXRX EX GEL
Freq: Once | CUTANEOUS | Status: AC
  Administered 2014-01-18: 12:00:00 via TOPICAL

## 2014-01-18 MED ORDER — ALRA NON-METALLIC DEODORANT (RAD-ONC)
1.0000 "application " | Freq: Once | TOPICAL | Status: AC
  Administered 2014-01-18: 1 via TOPICAL

## 2014-01-18 NOTE — Progress Notes (Signed)
Makayla [name] was given the Radiation Therapy and You book and discussed side effects/management of fatigue, pain and skin changes.  She was given Alra deoderant and Radiaplex gel.  She was instructed to apply the Radiaplex after treatment and at bedtime.  She was educated about under treat day with Dr. Sondra Come on Tuesdays.  She was given my business card and was instructed to call with any questions and concerns.

## 2014-01-19 ENCOUNTER — Ambulatory Visit
Admission: RE | Admit: 2014-01-19 | Discharge: 2014-01-19 | Disposition: A | Discharge status: Home / Self Care | Payer: Medicare Other | Source: Ambulatory Visit | Attending: Radiation Oncology | Admitting: Radiation Oncology

## 2014-01-19 VITALS — BP 172/70 | HR 68 | Temp 97.4°F

## 2014-01-19 DIAGNOSIS — C50419 Malignant neoplasm of upper-outer quadrant of unspecified female breast: Secondary | ICD-10-CM | POA: Diagnosis not present

## 2014-01-19 DIAGNOSIS — L299 Pruritus, unspecified: Secondary | ICD-10-CM | POA: Diagnosis not present

## 2014-01-19 DIAGNOSIS — Z510 Encounter for antineoplastic radiation therapy: Secondary | ICD-10-CM | POA: Diagnosis not present

## 2014-01-19 DIAGNOSIS — R920 Mammographic microcalcification found on diagnostic imaging of breast: Secondary | ICD-10-CM

## 2014-01-19 DIAGNOSIS — D0590 Unspecified type of carcinoma in situ of unspecified breast: Secondary | ICD-10-CM | POA: Diagnosis not present

## 2014-01-19 DIAGNOSIS — L589 Radiodermatitis, unspecified: Secondary | ICD-10-CM | POA: Diagnosis not present

## 2014-01-19 NOTE — Progress Notes (Signed)
  Radiation Oncology         [phone]) [phone] ________________________________  Name: Makayla [name] MRN: [id number]  Date: 01/19/2014  DOB: 13-Jun-1934  Weekly Radiation Therapy Management  Current Dose: 13.35 Gy     Planned Dose:  42.72 Gy  Narrative . . . . . . . . The patient presents for routine under treatment assessment.                                   The patient is without complaint.                                 Set-up films were reviewed.                                 The chart was checked. Physical Findings. . .  temperature is 97.4 F (36.3 C). Her blood pressure is 172/70 and her pulse is 68. . Weight essentially stable.  No significant changes. Impression . . . . . . . The patient is tolerating radiation. Plan . . . . . . . . . . . . Continue treatment as planned.  ________________________________   Blair Promise, PhD, MD

## 2014-01-19 NOTE — Progress Notes (Signed)
Makayla [name] has had 5 fractions to her left breast.  She denies pain and fatigue.  The skin on her left breast is intact.  She is using radiaplex gel twice a day.

## 2014-01-20 ENCOUNTER — Ambulatory Visit
Admission: RE | Admit: 2014-01-20 | Discharge: 2014-01-20 | Disposition: A | Discharge status: Home / Self Care | Payer: Medicare Other | Source: Ambulatory Visit | Attending: Radiation Oncology | Admitting: Radiation Oncology

## 2014-01-20 DIAGNOSIS — L589 Radiodermatitis, unspecified: Secondary | ICD-10-CM | POA: Diagnosis not present

## 2014-01-20 DIAGNOSIS — Z510 Encounter for antineoplastic radiation therapy: Secondary | ICD-10-CM | POA: Diagnosis not present

## 2014-01-20 DIAGNOSIS — L299 Pruritus, unspecified: Secondary | ICD-10-CM | POA: Diagnosis not present

## 2014-01-20 DIAGNOSIS — C50419 Malignant neoplasm of upper-outer quadrant of unspecified female breast: Secondary | ICD-10-CM | POA: Diagnosis not present

## 2014-01-20 DIAGNOSIS — D0590 Unspecified type of carcinoma in situ of unspecified breast: Secondary | ICD-10-CM | POA: Diagnosis not present

## 2014-01-21 ENCOUNTER — Ambulatory Visit
Admission: RE | Admit: 2014-01-21 | Discharge: 2014-01-21 | Disposition: A | Discharge status: Home / Self Care | Payer: Medicare Other | Source: Ambulatory Visit | Attending: Radiation Oncology | Admitting: Radiation Oncology

## 2014-01-21 DIAGNOSIS — L589 Radiodermatitis, unspecified: Secondary | ICD-10-CM | POA: Diagnosis not present

## 2014-01-21 DIAGNOSIS — C50419 Malignant neoplasm of upper-outer quadrant of unspecified female breast: Secondary | ICD-10-CM | POA: Diagnosis not present

## 2014-01-21 DIAGNOSIS — D0590 Unspecified type of carcinoma in situ of unspecified breast: Secondary | ICD-10-CM | POA: Diagnosis not present

## 2014-01-21 DIAGNOSIS — Z510 Encounter for antineoplastic radiation therapy: Secondary | ICD-10-CM | POA: Diagnosis not present

## 2014-01-21 DIAGNOSIS — L299 Pruritus, unspecified: Secondary | ICD-10-CM | POA: Diagnosis not present

## 2014-01-22 ENCOUNTER — Ambulatory Visit
Admission: RE | Admit: 2014-01-22 | Discharge: 2014-01-22 | Disposition: A | Discharge status: Home / Self Care | Payer: Medicare Other | Source: Ambulatory Visit | Attending: Radiation Oncology | Admitting: Radiation Oncology

## 2014-01-22 DIAGNOSIS — C50419 Malignant neoplasm of upper-outer quadrant of unspecified female breast: Secondary | ICD-10-CM | POA: Diagnosis not present

## 2014-01-22 DIAGNOSIS — L299 Pruritus, unspecified: Secondary | ICD-10-CM | POA: Diagnosis not present

## 2014-01-22 DIAGNOSIS — D0590 Unspecified type of carcinoma in situ of unspecified breast: Secondary | ICD-10-CM | POA: Diagnosis not present

## 2014-01-22 DIAGNOSIS — Z510 Encounter for antineoplastic radiation therapy: Secondary | ICD-10-CM | POA: Diagnosis not present

## 2014-01-22 DIAGNOSIS — L589 Radiodermatitis, unspecified: Secondary | ICD-10-CM | POA: Diagnosis not present

## 2014-01-25 ENCOUNTER — Ambulatory Visit
Admission: RE | Admit: 2014-01-25 | Discharge: 2014-01-25 | Disposition: A | Discharge status: Home / Self Care | Payer: Medicare Other | Source: Ambulatory Visit | Attending: Radiation Oncology | Admitting: Radiation Oncology

## 2014-01-25 DIAGNOSIS — D0590 Unspecified type of carcinoma in situ of unspecified breast: Secondary | ICD-10-CM | POA: Diagnosis not present

## 2014-01-25 DIAGNOSIS — Z510 Encounter for antineoplastic radiation therapy: Secondary | ICD-10-CM | POA: Diagnosis not present

## 2014-01-25 DIAGNOSIS — L589 Radiodermatitis, unspecified: Secondary | ICD-10-CM | POA: Diagnosis not present

## 2014-01-25 DIAGNOSIS — C50419 Malignant neoplasm of upper-outer quadrant of unspecified female breast: Secondary | ICD-10-CM | POA: Diagnosis not present

## 2014-01-25 DIAGNOSIS — L299 Pruritus, unspecified: Secondary | ICD-10-CM | POA: Diagnosis not present

## 2014-01-26 ENCOUNTER — Ambulatory Visit
Admission: RE | Admit: 2014-01-26 | Discharge: 2014-01-26 | Disposition: A | Discharge status: Home / Self Care | Payer: Medicare Other | Source: Ambulatory Visit | Attending: Radiation Oncology | Admitting: Radiation Oncology

## 2014-01-26 VITALS — BP 156/67 | HR 65 | Temp 97.3°F | Ht 66.0 in | Wt 156.7 lb

## 2014-01-26 DIAGNOSIS — L299 Pruritus, unspecified: Secondary | ICD-10-CM | POA: Diagnosis not present

## 2014-01-26 DIAGNOSIS — D0590 Unspecified type of carcinoma in situ of unspecified breast: Secondary | ICD-10-CM | POA: Diagnosis not present

## 2014-01-26 DIAGNOSIS — C50419 Malignant neoplasm of upper-outer quadrant of unspecified female breast: Secondary | ICD-10-CM | POA: Diagnosis not present

## 2014-01-26 DIAGNOSIS — Z510 Encounter for antineoplastic radiation therapy: Secondary | ICD-10-CM | POA: Diagnosis not present

## 2014-01-26 DIAGNOSIS — L589 Radiodermatitis, unspecified: Secondary | ICD-10-CM | POA: Diagnosis not present

## 2014-01-26 DIAGNOSIS — C50412 Malignant neoplasm of upper-outer quadrant of left female breast: Secondary | ICD-10-CM

## 2014-01-26 NOTE — Progress Notes (Signed)
  Radiation Oncology         [phone]) [phone] ________________________________  Name: Makayla [name] MRN: [id number]  Date: 01/26/2014  DOB: April 29, 1934  Weekly Radiation Therapy Management  Current Dose: 26.2 Gy     Planned Dose:  42.72 Gy  Narrative . . . . . . . . The patient presents for routine under treatment assessment.                                   The patient is without complaint.                                 Set-up films were reviewed.                                 The chart was checked. Physical Findings. . .  height is 5\' 6"  (1.676 m) and weight is 156 lb 11.2 oz (71.079 kg). Her temperature is 97.3 F (36.3 C). Her blood pressure is 156/67 and her pulse is 65. . Weight essentially stable.  Moderate radiation dermatitis noted in the left breast area. No skin breakdown. Impression . . . . . . . The patient is tolerating radiation. Plan . . . . . . . . . . . . Continue treatment as planned.  ________________________________   Blair Promise, PhD, MD

## 2014-01-26 NOTE — Progress Notes (Signed)
Makayla [name] has had 10 fractions to her left breast.  She denies pain and fatigue.  The skin on her left breast is pink.  She also has a raised, scattered rash underneath her breast.  She is using radiaplex gel twice a day.

## 2014-01-27 ENCOUNTER — Ambulatory Visit
Admission: RE | Admit: 2014-01-27 | Discharge: 2014-01-27 | Disposition: A | Discharge status: Home / Self Care | Payer: Medicare Other | Source: Ambulatory Visit | Attending: Radiation Oncology | Admitting: Radiation Oncology

## 2014-01-27 DIAGNOSIS — C50419 Malignant neoplasm of upper-outer quadrant of unspecified female breast: Secondary | ICD-10-CM | POA: Diagnosis not present

## 2014-01-27 DIAGNOSIS — Z510 Encounter for antineoplastic radiation therapy: Secondary | ICD-10-CM | POA: Diagnosis not present

## 2014-01-27 DIAGNOSIS — D0590 Unspecified type of carcinoma in situ of unspecified breast: Secondary | ICD-10-CM | POA: Diagnosis not present

## 2014-01-27 DIAGNOSIS — L589 Radiodermatitis, unspecified: Secondary | ICD-10-CM | POA: Diagnosis not present

## 2014-01-27 DIAGNOSIS — L299 Pruritus, unspecified: Secondary | ICD-10-CM | POA: Diagnosis not present

## 2014-01-28 ENCOUNTER — Ambulatory Visit
Admission: RE | Admit: 2014-01-28 | Discharge: 2014-01-28 | Disposition: A | Discharge status: Home / Self Care | Payer: Medicare Other | Source: Ambulatory Visit | Attending: Radiation Oncology | Admitting: Radiation Oncology

## 2014-01-28 DIAGNOSIS — Z510 Encounter for antineoplastic radiation therapy: Secondary | ICD-10-CM | POA: Diagnosis not present

## 2014-01-28 DIAGNOSIS — D0590 Unspecified type of carcinoma in situ of unspecified breast: Secondary | ICD-10-CM | POA: Diagnosis not present

## 2014-01-28 DIAGNOSIS — L299 Pruritus, unspecified: Secondary | ICD-10-CM | POA: Diagnosis not present

## 2014-01-28 DIAGNOSIS — C50419 Malignant neoplasm of upper-outer quadrant of unspecified female breast: Secondary | ICD-10-CM | POA: Diagnosis not present

## 2014-01-28 DIAGNOSIS — L589 Radiodermatitis, unspecified: Secondary | ICD-10-CM | POA: Diagnosis not present

## 2014-01-29 ENCOUNTER — Telehealth: Payer: Self-pay | Admitting: *Deleted

## 2014-01-29 ENCOUNTER — Ambulatory Visit
Admission: RE | Admit: 2014-01-29 | Discharge: 2014-01-29 | Disposition: A | Discharge status: Home / Self Care | Payer: Medicare Other | Source: Ambulatory Visit | Attending: Radiation Oncology | Admitting: Radiation Oncology

## 2014-01-29 DIAGNOSIS — C50419 Malignant neoplasm of upper-outer quadrant of unspecified female breast: Secondary | ICD-10-CM | POA: Diagnosis not present

## 2014-01-29 DIAGNOSIS — L299 Pruritus, unspecified: Secondary | ICD-10-CM | POA: Diagnosis not present

## 2014-01-29 DIAGNOSIS — D0590 Unspecified type of carcinoma in situ of unspecified breast: Secondary | ICD-10-CM | POA: Diagnosis not present

## 2014-01-29 DIAGNOSIS — Z510 Encounter for antineoplastic radiation therapy: Secondary | ICD-10-CM | POA: Diagnosis not present

## 2014-01-29 DIAGNOSIS — L589 Radiodermatitis, unspecified: Secondary | ICD-10-CM | POA: Diagnosis not present

## 2014-01-29 NOTE — Telephone Encounter (Signed)
Mailed after appt letter to pt. 

## 2014-02-01 ENCOUNTER — Ambulatory Visit
Admission: RE | Admit: 2014-02-01 | Discharge: 2014-02-01 | Disposition: A | Discharge status: Home / Self Care | Payer: Medicare Other | Source: Ambulatory Visit | Attending: Radiation Oncology | Admitting: Radiation Oncology

## 2014-02-01 DIAGNOSIS — D0590 Unspecified type of carcinoma in situ of unspecified breast: Secondary | ICD-10-CM | POA: Diagnosis not present

## 2014-02-01 DIAGNOSIS — L299 Pruritus, unspecified: Secondary | ICD-10-CM | POA: Diagnosis not present

## 2014-02-01 DIAGNOSIS — C50419 Malignant neoplasm of upper-outer quadrant of unspecified female breast: Secondary | ICD-10-CM | POA: Diagnosis not present

## 2014-02-01 DIAGNOSIS — L589 Radiodermatitis, unspecified: Secondary | ICD-10-CM | POA: Diagnosis not present

## 2014-02-01 DIAGNOSIS — Z510 Encounter for antineoplastic radiation therapy: Secondary | ICD-10-CM | POA: Diagnosis not present

## 2014-02-02 ENCOUNTER — Ambulatory Visit
Admission: RE | Admit: 2014-02-02 | Discharge: 2014-02-02 | Disposition: A | Discharge status: Home / Self Care | Payer: Medicare Other | Source: Ambulatory Visit | Attending: Radiation Oncology | Admitting: Radiation Oncology

## 2014-02-02 VITALS — BP 148/69 | HR 71 | Temp 97.6°F | Ht 66.0 in | Wt 157.0 lb

## 2014-02-02 DIAGNOSIS — C50419 Malignant neoplasm of upper-outer quadrant of unspecified female breast: Secondary | ICD-10-CM | POA: Diagnosis not present

## 2014-02-02 DIAGNOSIS — C50412 Malignant neoplasm of upper-outer quadrant of left female breast: Secondary | ICD-10-CM

## 2014-02-02 DIAGNOSIS — Z510 Encounter for antineoplastic radiation therapy: Secondary | ICD-10-CM | POA: Diagnosis not present

## 2014-02-02 DIAGNOSIS — R920 Mammographic microcalcification found on diagnostic imaging of breast: Secondary | ICD-10-CM

## 2014-02-02 DIAGNOSIS — L589 Radiodermatitis, unspecified: Secondary | ICD-10-CM | POA: Diagnosis not present

## 2014-02-02 DIAGNOSIS — D0590 Unspecified type of carcinoma in situ of unspecified breast: Secondary | ICD-10-CM | POA: Diagnosis not present

## 2014-02-02 DIAGNOSIS — L299 Pruritus, unspecified: Secondary | ICD-10-CM | POA: Diagnosis not present

## 2014-02-02 MED ORDER — BIAFINE EX EMUL
Freq: Two times a day (BID) | CUTANEOUS | Status: DC
  Administered 2014-02-02: 12:00:00 via TOPICAL

## 2014-02-02 NOTE — Progress Notes (Signed)
Makayla [name] has had 15 fractions to her left breast.  She reports soreness in her left breast.  She reports fatigue.  She has a scattered, red rash on and under her left breast.  She reports that it is itchy.  She is using radiaplex gel.  Follow up appointment card given.

## 2014-02-02 NOTE — Progress Notes (Signed)
  Radiation Oncology         [phone]) [phone] ________________________________  Name: Makayla [name] MRN: [id number]  Date: 02/02/2014  DOB: [birth year]  Weekly Radiation Therapy Management  Current Dose: 40.05 Gy     Planned Dose:  42.72 Gy  Narrative . . . . . . . . The patient presents for routine under treatment assessment.                                   The patient is without complaint except for soreness in the breast and fatigue. She also reports some itching in the breast area. She will be switched to Biafine                                 Set-up films were reviewed.                                 The chart was checked. Physical Findings. . .  height is 5\' 6"  (1.676 m) and weight is 157 lb (71.215 kg). Her temperature is 97.6 F (36.4 C). Her blood pressure is 148/69 and her pulse is 71. . Weight essentially stable.  Radiation dermatitis in the upper aspect of the breast and inframammary fold area. No moist desquamation. Impression . . . . . . . The patient is tolerating radiation. Plan . . . . . . . . . . . . Continue treatment as planned.  ________________________________   Blair Promise, PhD, MD

## 2014-02-02 NOTE — Addendum Note (Signed)
Encounter addended by: Jacqulyn Liner, RN on: 02/02/2014 12:05 PM<BR>     Documentation filed: Inpatient MAR, Orders

## 2014-02-03 ENCOUNTER — Ambulatory Visit
Admission: RE | Admit: 2014-02-03 | Discharge: 2014-02-03 | Disposition: A | Discharge status: Home / Self Care | Payer: Medicare Other | Source: Ambulatory Visit | Attending: Radiation Oncology | Admitting: Radiation Oncology

## 2014-02-03 DIAGNOSIS — L589 Radiodermatitis, unspecified: Secondary | ICD-10-CM | POA: Diagnosis not present

## 2014-02-03 DIAGNOSIS — Z510 Encounter for antineoplastic radiation therapy: Secondary | ICD-10-CM | POA: Diagnosis not present

## 2014-02-03 DIAGNOSIS — D0590 Unspecified type of carcinoma in situ of unspecified breast: Secondary | ICD-10-CM | POA: Diagnosis not present

## 2014-02-03 DIAGNOSIS — L299 Pruritus, unspecified: Secondary | ICD-10-CM | POA: Diagnosis not present

## 2014-02-03 DIAGNOSIS — C50419 Malignant neoplasm of upper-outer quadrant of unspecified female breast: Secondary | ICD-10-CM | POA: Diagnosis not present

## 2014-02-04 ENCOUNTER — Encounter: Payer: Self-pay | Admitting: Radiation Oncology

## 2014-02-04 NOTE — Progress Notes (Signed)
  Radiation Oncology         [phone]) [phone] ________________________________  Name: Makayla [name] MRN: [id number]  Date: 02/04/2014  DOB: Feb 06, 1934  End of Treatment Note  Diagnosis:   Intraductal carcinoma of the left breast     Indication for treatment:  Breast conservation therapy       Radiation treatment dates:   March 18 through February 8  Site/dose:   Left breast 42.72 gray in 16 fractions  Beams/energy:   3-D conformal with breath hold techniques, tangential beam arrangement  Narrative: The patient tolerated radiation treatment relatively well.   She did experience some soreness within the breast and fatigue as well as some mild itching.  No moist desquamation  Plan: The patient has completed radiation treatment. The patient will return to radiation oncology clinic for routine followup in one month. I advised them to call or return sooner if they have any questions or concerns related to their recovery or treatment.  -----------------------------------  Blair Promise, PhD, MD

## 2014-02-18 ENCOUNTER — Telehealth: Payer: Self-pay | Admitting: Oncology

## 2014-02-18 NOTE — Telephone Encounter (Signed)
LVM ADVISING APPT 5/1 CX'D DUE TO KK LOA. ADVISED IN VM APPT MOVED TO 4/30 @ 3.30P WITH DR. Earnest Conroy.

## 2014-02-25 ENCOUNTER — Telehealth: Payer: Self-pay | Admitting: Adult Health

## 2014-02-25 ENCOUNTER — Ambulatory Visit (HOSPITAL_BASED_OUTPATIENT_CLINIC_OR_DEPARTMENT_OTHER): Payer: Medicare Other | Admitting: Hematology and Oncology

## 2014-02-25 ENCOUNTER — Other Ambulatory Visit: Payer: Self-pay | Admitting: *Deleted

## 2014-02-25 VITALS — BP 165/83 | HR 87 | Temp 98.0°F | Resp 18 | Ht 66.0 in | Wt 156.7 lb

## 2014-02-25 DIAGNOSIS — Z79810 Long term (current) use of selective estrogen receptor modulators (SERMs): Secondary | ICD-10-CM

## 2014-02-25 DIAGNOSIS — Z923 Personal history of irradiation: Secondary | ICD-10-CM | POA: Diagnosis not present

## 2014-02-25 DIAGNOSIS — Z170 Estrogen receptor positive status [ER+]: Secondary | ICD-10-CM

## 2014-02-25 DIAGNOSIS — D0590 Unspecified type of carcinoma in situ of unspecified breast: Secondary | ICD-10-CM | POA: Diagnosis not present

## 2014-02-25 DIAGNOSIS — R920 Mammographic microcalcification found on diagnostic imaging of breast: Secondary | ICD-10-CM

## 2014-02-25 MED ORDER — TAMOXIFEN CITRATE 20 MG PO TABS: 20.0000 mg | ORAL_TABLET | Freq: Every day | ORAL | Status: DC

## 2014-02-25 NOTE — Telephone Encounter (Signed)
, °

## 2014-02-26 ENCOUNTER — Ambulatory Visit: Payer: Medicare Other | Admitting: Oncology

## 2014-03-06 NOTE — Progress Notes (Signed)
Makayla [name] [id number] 01-12-1934 80 y.o. 03/06/2014 8:37 PM  CC Dr. Pat Kocher Dr. Jamey Reas Dr. Erroll Luna Dr. Gery Pray  REFERRING PHYSICIAN: Dr. Erroll Luna   Chief complaint: Follow up visit for breast cancer  Diagnosis:  left breast DCIS.   STAGE:    Pathologic: Stage 0 (T0, NX, cM0) signed by Deatra Robinson, MD on 01/03/2014  5:02 PM   Summary: Stage 0 (T0, NX, cM0)   HISTORY OF PRESENT ILLNESS: As per previously documented Dr. Laurelyn Sickle note:  Makayla [name] is a 80 y.o. female.  Had a mammogram performed which was abnormal. She was noted to have abnormal calcifications within the upper-outer quadrant of the left breast. Stereotactic biopsy was attempted but could not be done. Therefore patient was seen by Dr. Erroll Luna and she underwent a left breast lumpectomy with a wire localization. The final pathology revealed intermediate grade ductal carcinoma in situ with comedonecrosis and calcifications. The area was measured at 2.1 cm. The anterior margin was initially focally positive she had a reexcision that showed no residual tumor. The tumor was estrogen receptor +100% progesterone receptor +96%. Postoperatively patient has done well. She was seen by Dr. Gery Pray in radiation oncology. He did recommend radiation therapy adjuvantly. Patient is now seen in medical oncology for discussion of adjuvant antiestrogen therapy treatment. She is without any complaints. 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 She lives in [place].   Interval history: Makayla [name] is here for followup visit for DCIS. Since the time of last visit she has completed radiation therapy in 02/03/2014. She tolerated radiation therapy relatively well with minimal side effects of itching and radiation changes in the left breast. She denies any weight loss or decrease in appetite denies headaches, blurred vision, blood in the stool or blood in the urine. Denies any  shortness of breath, chest pain, palpitations, fever.  she says she is scheduled to have the mammogram in June 2015  Past Medical History: Past Medical History  Diagnosis Date  . Hyperlipidemia   . Wears glasses   . Breast cancer     left    Past Surgical History: Past Surgical History  Procedure Laterality Date  . Vaginal hysterectomy  1991    BSO  . Tonsillectomy    . Breast lumpectomy with needle localization Left 11/04/2013    Procedure: BREAST LUMPECTOMY WITH NEEDLE LOCALIZATION;  Surgeon: Marcello Moores A. Cornett, MD;  Location: [place];  Service: General;  Laterality: Left;  . Re-excision of breast lumpectomy Left 12/08/2013    Procedure: BREAST LUMPECTOMY WITH NEEDLE LOCALIZATION;  Surgeon: Marcello Moores A. Cornett, MD;  Location: [place];  Service: General;  Laterality: Left;    Family History: Family History  Problem Relation Age of Onset  . Breast cancer Sister     Social Histor History  Substance Use Topics  . Smoking status: Never Smoker   . Smokeless tobacco: Not on file  . Alcohol Use: No     Comment: occasional wine    Allergies: Allergies  Allergen Reactions  . Codeine Nausea And Vomiting  . Sulfa Antibiotics Nausea And Vomiting    Current Medications: Current Outpatient Prescriptions  Medication Sig Dispense Refill  . aspirin 81 MG tablet Take 81 mg by mouth daily.      Marland Kitchen atorvastatin (LIPITOR) 10 MG tablet Take 10 mg by mouth daily.      . Calcium-Vitamin D-Vitamin K (VIACTIV PO) Take 1,000 mg  by mouth daily.      Marland Kitchen loratadine (CLARITIN) 10 MG tablet Take 10 mg by mouth daily.      . Multiple Vitamin (MULTIVITAMIN) tablet Take 1 tablet by mouth daily.      . Omega-3 Fatty Acids (FISH OIL) 1000 MG CAPS Take by mouth daily.      . tamoxifen (NOLVADEX) 20 MG tablet Take 1 tablet (20 mg total) by mouth daily.  90 tablet  3   No current facility-administered medications for this visit.    OB/GYN History: menarche at 84,  menopause in 16, HRT now weaning off, G2P2 age first live 66  Fertility Discussion: n/a Prior History of Cancer: no  Health Maintenance:  Colonoscopy no Bone Density yes Last PAP smear 2011 or 2012  ECOG PERFORMANCE STATUS: 0 - Asymptomatic  Genetic Counseling/testing: no  REVIEW OF SYSTEMS:  A 10 point review of symptoms were assessed and pertinent symptoms were mentioned in interval history  PHYSICAL EXAMINATION: Blood pressure 165/83, pulse 87, temperature 98 F (36.7 C), temperature source Oral, resp. rate 18, height 5\' 6"  (1.676 m), weight 156 lb 11.2 oz (71.079 kg).  General:  well-nourished in no acute distress.   Eyes:  no scleral icterus.  ENT:  There were no oropharyngeal lesions.   Neck was without thyromegaly.   Lymphatics:  Negative cervical, supraclavicular or axillary adenopathy.   Respiratory: lungs were clear bilaterally without wheezing or crackles.   Cardiovascular:  Regular rate and rhythm, S1/S2, without murmur, rub or gallop.  There was no pedal edema.   GI:  abdomen was soft, flat, nontender, nondistended, without organomegaly.   Muscoloskeletal:  no spinal tenderness of palpation of vertebral spine.   Skin exam was without echymosis, petichae.   Neuro exam was nonfocal.    Gait was normal.  Patient was alerted and oriented.   Mood was normal without depression.   Breasts: right breast normal without mass, skin or nipple changes or axillary nodes, surgical scars noted in the left breast well-healed, and radiation changes noted in the left breast   LABS:    Chemistry      Component Value Date/Time   NA 142 12/30/2013 1500      Component Value Date/Time   CALCIUM 10.4 12/30/2013 1500      Lab Results  Component Value Date   WBC 9.5 12/30/2013   HGB 14.5 12/30/2013   HCT 43.4 12/30/2013   MCV 92.7 12/30/2013   PLT 395 12/30/2013   PATHOLOGY: 11/04/13 PROGNOSTIC INDICATORS - ACIS Results: IMMUNOHISTOCHEMICAL AND MORPHOMETRIC ANALYSIS BY THE AUTOMATED  CELLULAR IMAGING SYSTEM (ACIS) Estrogen Receptor: 100%, POSITIVE, STRONG STAINING INTENSITY Progesterone Receptor: 75%, POSITIVE, STRONG STAINING INTENSITY REFERENCE RANGE ESTROGEN RECEPTOR NEGATIVE <1% POSITIVE =>1% PROGESTERONE RECEPTOR NEGATIVE <1% POSITIVE =>1% All controls stained appropriately Claudette Laws MD Pathologist, Electronic Signature ( Signed 12/25/2013) FINAL DIAGNOSIS Diagnosis Breast, right, needle core biopsy, lateral - DUCTAL CARCINOMA IN SITU WITH CALCIFICATIONS, SEE COMMENT. - LOBULAR NEOPLASIA (ATYPICAL LOBULAR HYPERPLASIA). Microscopic Comment Although the grade of tumor is best assessed at resection , in these biopsies, the in situ carcinoma is grade I. 1 of 2 FINAL for COX, AMANDA G ([id number]) Microscopic Comment(continued) Breast prognostic studies are pending and  ASSESSMENT/PLAN    80 year old female with  #1 Ductal carcinoma in situ-stage 0 (Tis NX)  with comedonecrosis measuring 2.1 cm,ER positive, PR positive. Patient is status post lumpectomy on 11/04/2013.   #2 Elania completed radiation treatment on  02/03/2014  #3 I have once again  reiterated the benefits and side effects of anti-estrogen therapy with tamoxifen. Side effects including hot flashes, thrombosisand rare incidence of uterine cancer. Patient understood the same and would like to proceed with tamoxifen. Plan is to continue tamoxifen for total of 5 years. I have prescribed 3 month supply of tamoxifen with 3 refills.  #4 I have asked the patient to check with gynecology since she underwent vaginal hysterectomy and BSO, for the need of annual pelvic exam and Pap smear while she is on tamoxifen  #5 follow up with surgery and radiation oncology as scheduled  Next followup visit in July 2015 as scheduled with CBC and CMP on the day of next visit  All questions were answered. The patient knows to call the clinic with any problems, questions or concerns. We can certainly see the  patient much sooner if necessary.  I spent 25 minutes counseling the patient face to face. The total time spent in the appointment was 58minutes.  Wilmon Arms, MD Medical/Oncology [facility] [phone] (Office)  03/06/2014, 8:37 PM

## 2014-03-19 ENCOUNTER — Encounter: Payer: Self-pay | Admitting: Oncology

## 2014-03-25 ENCOUNTER — Ambulatory Visit
Admission: RE | Admit: 2014-03-25 | Discharge: 2014-03-25 | Disposition: A | Discharge status: Home / Self Care | Payer: Medicare Other | Source: Ambulatory Visit | Attending: Radiation Oncology | Admitting: Radiation Oncology

## 2014-03-25 ENCOUNTER — Encounter: Payer: Self-pay | Admitting: Radiation Oncology

## 2014-03-25 VITALS — BP 141/63 | HR 81 | Temp 97.7°F | Ht 66.0 in | Wt 156.8 lb

## 2014-03-25 DIAGNOSIS — C50412 Malignant neoplasm of upper-outer quadrant of left female breast: Secondary | ICD-10-CM

## 2014-03-25 NOTE — Progress Notes (Signed)
  Radiation Oncology         [phone] ________________________________  Name: Makayla [name] MRN: [id number]  Date: 03/25/2014  DOB: 1934-08-06  Follow-Up Visit Note  CC: No primary provider on file.  Cornett, Joyice Faster., MD  Diagnosis:   Intraductal carcinoma of the left breast  Interval Since Last Radiation:  6  weeks  Narrative:  The patient returns today for routine follow-up.  She completed her radiation therapy on April 8. Patient did have significant dermatitis for 2-3 weeks after completion of her treatment. This issue has resolved at this time. She denies any discomfort or pain in the breast,  nipple discharge or bleeding. She has started tamoxifen and seems to be tolerating this well.                              ALLERGIES:  is allergic to codeine and sulfa antibiotics.  Meds: Current Outpatient Prescriptions  Medication Sig Dispense Refill  . aspirin 81 MG tablet Take 81 mg by mouth daily.      Marland Kitchen atorvastatin (LIPITOR) 10 MG tablet Take 10 mg by mouth daily.      . Calcium-Vitamin D-Vitamin K (VIACTIV PO) Take 1,000 mg by mouth daily.      Marland Kitchen loratadine (CLARITIN) 10 MG tablet Take 10 mg by mouth daily.      . Multiple Vitamin (MULTIVITAMIN) tablet Take 1 tablet by mouth daily.      . Omega-3 Fatty Acids (FISH OIL) 1000 MG CAPS Take by mouth daily.      . tamoxifen (NOLVADEX) 20 MG tablet Take 1 tablet (20 mg total) by mouth daily.  90 tablet  3   No current facility-administered medications for this encounter.    Physical Findings: The patient is in no acute distress. Patient is alert and oriented.  height is 5\' 6"  (1.676 m) and weight is 156 lb 12.8 oz (71.124 kg). Her oral temperature is 97.7 F (36.5 C). Her blood pressure is 141/63 and her pulse is 81. Marland Kitchen  No palpable supraclavicular or axillary adenopathy. The lungs are clear to auscultation. The heart has a regular rhythm and rate. Examination right breast reveals no mass or nipple discharge. Examination of  the left breast reveals mild hyperpigmentation changes. There is mild edema in the breast. No dominant masses appreciated. Patient's skin is well healed  Lab Findings: Lab Results  Component Value Date   WBC 9.5 12/30/2013   HGB 14.5 12/30/2013   HCT 43.4 12/30/2013   MCV 92.7 12/30/2013   PLT 395 12/30/2013        Impression:  The patient is recovering from the effects of radiation.  No evidence of recurrence on clinical exam today  Plan:  Routine followup in 6 months.  ____________________________________ Blair Promise, MD

## 2014-03-25 NOTE — Progress Notes (Signed)
Makayla [name] here for follow up after treatment to her left breast.  She denies pain and fatigue.  She is taking tamoxifen.  She said she has been having hot flashes but said they had started when she stopped taking estrogen.  She said she had dermatitis after radiation with itching.  The skin on her left breast is intact now.  She is using cocoa butter.  Her bp on arrival was elevated at 171/63 and 141/63 when rechecked.

## 2014-03-26 ENCOUNTER — Ambulatory Visit (INDEPENDENT_AMBULATORY_CARE_PROVIDER_SITE_OTHER): Payer: Medicare Other | Admitting: Surgery

## 2014-03-26 ENCOUNTER — Encounter (INDEPENDENT_AMBULATORY_CARE_PROVIDER_SITE_OTHER): Payer: Self-pay | Admitting: Surgery

## 2014-03-26 VITALS — BP 126/86 | HR 64 | Resp 14 | Ht 66.0 in | Wt 155.2 lb

## 2014-03-26 DIAGNOSIS — D0590 Unspecified type of carcinoma in situ of unspecified breast: Secondary | ICD-10-CM

## 2014-03-26 DIAGNOSIS — D0510 Intraductal carcinoma in situ of unspecified breast: Secondary | ICD-10-CM

## 2014-03-26 DIAGNOSIS — D0512 Intraductal carcinoma in situ of left breast: Secondary | ICD-10-CM | POA: Insufficient documentation

## 2014-03-26 NOTE — Patient Instructions (Signed)
Follow up 1 year 

## 2014-03-26 NOTE — Progress Notes (Signed)
NAME: Makayla [name]       DOB: [birth year]           DATE: 03/26/2014        MRN: [id number]  CC:  No chief complaint on file.   Makayla [name] is a 80 y.o.Marland Kitchenfemale who presents for routine followup of her Left breast DCIS  diagnosed in 1/15  and treated with LUMPECTOMY WITH RE EXCISION  AND RADIATION. She has no problems or concerns on either side.  PFSH: She has had no significant changes since the last visit here.  ROS: There have been no significant changes since the last visit here  EXAM:  VS: BP 126/86  Pulse 64  Resp 14  Ht 5\' 6"  (1.676 m)  Wt 155 lb 3.2 oz (70.398 kg)  BMI 25.06 kg/m2  General: The patient is alert, oriented, generally healthy appearing, NAD. Mood and affect are normal.  Breasts:  LEFT BREAST incision healed.  No skin changes  Right breast normal  Lymphatics: She has no axillary or supraclavicular adenopathy on either side.  Extremities: Full ROM of the surgical side with no lymphedema noted.  Data Reviewed: Dr Sondra Come notes  Impression: Doing well, with no evidence of recurrent cancer or new cancer  Plan: Will continue to follow up on an annual basis here.

## 2014-05-20 DIAGNOSIS — Z7189 Other specified counseling: Secondary | ICD-10-CM | POA: Diagnosis not present

## 2014-05-20 DIAGNOSIS — Z872 Personal history of diseases of the skin and subcutaneous tissue: Secondary | ICD-10-CM | POA: Diagnosis not present

## 2014-05-20 DIAGNOSIS — L570 Actinic keratosis: Secondary | ICD-10-CM | POA: Diagnosis not present

## 2014-05-20 DIAGNOSIS — L408 Other psoriasis: Secondary | ICD-10-CM | POA: Diagnosis not present

## 2014-05-27 ENCOUNTER — Other Ambulatory Visit: Payer: Self-pay

## 2014-05-27 DIAGNOSIS — D0512 Intraductal carcinoma in situ of left breast: Secondary | ICD-10-CM

## 2014-05-28 ENCOUNTER — Other Ambulatory Visit (HOSPITAL_BASED_OUTPATIENT_CLINIC_OR_DEPARTMENT_OTHER): Payer: Medicare Other

## 2014-05-28 ENCOUNTER — Encounter: Payer: Self-pay | Admitting: Adult Health

## 2014-05-28 ENCOUNTER — Telehealth: Payer: Self-pay | Admitting: Adult Health

## 2014-05-28 ENCOUNTER — Ambulatory Visit (HOSPITAL_BASED_OUTPATIENT_CLINIC_OR_DEPARTMENT_OTHER): Payer: Medicare Other | Admitting: Adult Health

## 2014-05-28 VITALS — BP 146/69 | HR 85 | Temp 98.0°F | Resp 18 | Ht 66.0 in | Wt 159.2 lb

## 2014-05-28 DIAGNOSIS — Z170 Estrogen receptor positive status [ER+]: Secondary | ICD-10-CM | POA: Diagnosis not present

## 2014-05-28 DIAGNOSIS — D0590 Unspecified type of carcinoma in situ of unspecified breast: Secondary | ICD-10-CM

## 2014-05-28 DIAGNOSIS — C50412 Malignant neoplasm of upper-outer quadrant of left female breast: Secondary | ICD-10-CM

## 2014-05-28 DIAGNOSIS — D0512 Intraductal carcinoma in situ of left breast: Secondary | ICD-10-CM

## 2014-05-28 LAB — CBC WITH DIFFERENTIAL/PLATELET
BASO%: 0.3 % (ref 0.0–2.0)
Basophils Absolute: 0 10*3/uL (ref 0.0–0.1)
EOS ABS: 0.2 10*3/uL (ref 0.0–0.5)
EOS%: 2 % (ref 0.0–7.0)
HCT: 40.3 % (ref 34.8–46.6)
HEMOGLOBIN: 13.7 g/dL (ref 11.6–15.9)
LYMPH#: 1.9 10*3/uL (ref 0.9–3.3)
LYMPH%: 25.6 % (ref 14.0–49.7)
MCH: 31 pg (ref 25.1–34.0)
MCHC: 34 g/dL (ref 31.5–36.0)
MCV: 91.2 fL (ref 79.5–101.0)
MONO#: 0.5 10*3/uL (ref 0.1–0.9)
MONO%: 6 % (ref 0.0–14.0)
NEUT#: 5 10*3/uL (ref 1.5–6.5)
NEUT%: 66.1 % (ref 38.4–76.8)
Platelets: 248 10*3/uL (ref 145–400)
RBC: 4.42 10*6/uL (ref 3.70–5.45)
RDW: 12.5 % (ref 11.2–14.5)
WBC: 7.5 10*3/uL (ref 3.9–10.3)

## 2014-05-28 LAB — COMPREHENSIVE METABOLIC PANEL (CC13)
ALT: 22 U/L (ref 0–55)
AST: 24 U/L (ref 5–34)
Albumin: 3.6 g/dL (ref 3.5–5.0)
Alkaline Phosphatase: 54 U/L (ref 40–150)
Anion Gap: 9 mEq/L (ref 3–11)
BILIRUBIN TOTAL: 0.47 mg/dL (ref 0.20–1.20)
BUN: 17.9 mg/dL (ref 7.0–26.0)
CO2: 26 meq/L (ref 22–29)
CREATININE: 1.1 mg/dL (ref 0.6–1.1)
Calcium: 10.1 mg/dL (ref 8.4–10.4)
Chloride: 106 mEq/L (ref 98–109)
GLUCOSE: 159 mg/dL — AB (ref 70–140)
Potassium: 4.6 mEq/L (ref 3.5–5.1)
SODIUM: 141 meq/L (ref 136–145)
TOTAL PROTEIN: 6.9 g/dL (ref 6.4–8.3)

## 2014-05-28 NOTE — Telephone Encounter (Signed)
, °

## 2014-05-28 NOTE — Progress Notes (Signed)
Makayla [name] [id number] 09-Apr-1934 80 y.o. 05/30/2014 7:41 AM  CC Dr. Pat Kocher Dr. Jamey Reas Dr. Erroll Luna Dr. Gery Pray  REFERRING PHYSICIAN: Dr. Erroll Luna   Chief complaint: Follow up visit for breast cancer  Diagnosis:  left breast DCIS.   STAGE:    Pathologic: Stage 0 (T0, NX, cM0) signed by Makayla Robinson, MD on 01/03/2014  5:02 PM   Summary: Stage 0 (T0, NX, cM0)   PRIOR HISTORY: As per previously documented Dr. Laurelyn Sickle note:  Makayla [name] is a 80 y.o. female.  Had a mammogram performed which was abnormal. She was noted to have abnormal calcifications within the upper-outer quadrant of the left breast. Stereotactic biopsy was attempted but could not be done. Therefore patient was seen by Dr. Erroll Luna and she underwent a left breast lumpectomy with a wire localization. The final pathology revealed intermediate grade ductal carcinoma in situ with comedonecrosis and calcifications. The area was measured at 2.1 cm. The anterior margin was initially focally positive she had a reexcision that showed no residual tumor. The tumor was estrogen receptor +100% progesterone receptor +96%. Postoperatively patient has done well. She was seen by Dr. Gery Pray in radiation oncology. He did recommend radiation therapy adjuvantly. She was started on daily Tamoxifen in April, 2015.   INTERVAL HISTORY: Makayla [name] is here for follow up of her h/o DCIS.  She was recently started on Tamoxifen daily and is here today for evaluation of how she is tolerating the new therapy.  She is taking Tamoxifen daily with minimal adverse effects.  She denies hot flashes, joint aches, fevers, chills, new pain, or any further concerns.    Past Medical History: Past Medical History  Diagnosis Date  . Hyperlipidemia   . Wears glasses   . Breast cancer     left  . Radiation 01/13/14-12/06/13    left breast 42.72 gray    Past Surgical History: Past Surgical History  Procedure  Laterality Date  . Vaginal hysterectomy  1991    BSO  . Tonsillectomy    . Breast lumpectomy with needle localization Left 11/04/2013    Procedure: BREAST LUMPECTOMY WITH NEEDLE LOCALIZATION;  Surgeon: Marcello Moores A. Cornett, MD;  Location: [place];  Service: General;  Laterality: Left;  . Re-excision of breast lumpectomy Left 12/08/2013    Procedure: BREAST LUMPECTOMY WITH NEEDLE LOCALIZATION;  Surgeon: Marcello Moores A. Cornett, MD;  Location: [place];  Service: General;  Laterality: Left;    Family History: Family History  Problem Relation Age of Onset  . Breast cancer Sister     Social History History  Substance Use Topics  . Smoking status: Never Smoker   . Smokeless tobacco: Not on file  . Alcohol Use: No     Comment: occasional wine    Allergies: Allergies  Allergen Reactions  . Codeine Nausea And Vomiting  . Sulfa Antibiotics Nausea And Vomiting    Current Medications: Current Outpatient Prescriptions  Medication Sig Dispense Refill  . aspirin 81 MG tablet Take 81 mg by mouth daily.      Makayla [name] Kitchen atorvastatin (LIPITOR) 10 MG tablet Take 10 mg by mouth daily.      . Calcium-Vitamin D-Vitamin K (VIACTIV PO) Take 1,000 mg by mouth daily.      Makayla [name] Kitchen loratadine (CLARITIN) 10 MG tablet Take 10 mg by mouth daily.      . Multiple Vitamin (MULTIVITAMIN) tablet Take 1 tablet by mouth daily.      Makayla [name] Kitchen  Omega-3 Fatty Acids (FISH OIL) 1000 MG CAPS Take by mouth daily.      . tamoxifen (NOLVADEX) 20 MG tablet Take 1 tablet (20 mg total) by mouth daily.  90 tablet  3   No current facility-administered medications for this visit.    OB/GYN History: menarche at 15, menopause in 31, HRT now weaning off, G2P2 age first live 55  Fertility Discussion: n/a Prior History of Cancer: no  Health Maintenance:  Mammogram: 08/2013 Colonoscopy: never by choice Bone Density Scan: up to date, followed by Dr. Jamey Reas Pap Smear: s/p TAH in [place] Exam: 05/2014   Genetic  Counseling/testing: no  REVIEW OF SYSTEMS:  A 10 point review of symptoms were assessed and pertinent symptoms were mentioned in interval history  PHYSICAL EXAMINATION: Blood pressure 146/69, pulse 85, temperature 98 F (36.7 C), temperature source Oral, resp. rate 18, height 5\' 6"  (1.676 m), weight 159 lb 3.2 oz (72.213 kg). GENERAL: Patient is a well appearing female in no acute distress HEENT:  Sclerae anicteric.  Oropharynx clear and moist. No ulcerations or evidence of oropharyngeal candidiasis. Neck is supple.  NODES:  No cervical, supraclavicular, or axillary lymphadenopathy palpated.  BREAST EXAM:  Left breast lumpectomy site without nodularity, there are thickness and radiation changes noted in the left breast.  Right breast no masses, nodules.  Benign bilateral breast exam.   LUNGS:  Clear to auscultation bilaterally.  No wheezes or rhonchi. HEART:  Regular rate and rhythm. No murmur appreciated. ABDOMEN:  Soft, nontender.  Positive, normoactive bowel sounds. No organomegaly palpated. MSK:  No focal spinal tenderness to palpation. Full range of motion bilaterally in the upper extremities. EXTREMITIES:  No peripheral edema.   SKIN:  Clear with no obvious rashes or skin changes. No nail dyscrasia. NEURO:  Nonfocal. Well oriented.  Appropriate affect. ECOG PERFORMANCE STATUS: 0 - Asymptomatic  LABS:    Chemistry      Component Value Date/Time   NA 141 05/28/2014 1256      Component Value Date/Time   CALCIUM 10.1 05/28/2014 1256      Lab Results  Component Value Date   WBC 7.5 05/28/2014   HGB 13.7 05/28/2014   HCT 40.3 05/28/2014   MCV 91.2 05/28/2014   PLT 248 05/28/2014   PATHOLOGY: 11/04/13 PROGNOSTIC INDICATORS - ACIS Results: IMMUNOHISTOCHEMICAL AND MORPHOMETRIC ANALYSIS BY THE AUTOMATED CELLULAR IMAGING SYSTEM (ACIS) Estrogen Receptor: 100%, POSITIVE, STRONG STAINING INTENSITY Progesterone Receptor: 75%, POSITIVE, STRONG STAINING INTENSITY REFERENCE  RANGE ESTROGEN RECEPTOR NEGATIVE <1% POSITIVE =>1% PROGESTERONE RECEPTOR NEGATIVE <1% POSITIVE =>1% All controls stained appropriately Claudette Laws MD Pathologist, Electronic Signature ( Signed 12/25/2013) FINAL DIAGNOSIS Diagnosis Breast, right, needle core biopsy, lateral - DUCTAL CARCINOMA IN SITU WITH CALCIFICATIONS, SEE COMMENT. - LOBULAR NEOPLASIA (ATYPICAL LOBULAR HYPERPLASIA). Microscopic Comment Although the grade of tumor is best assessed at resection , in these biopsies, the in situ carcinoma is grade I. 1 of 2 FINAL for COX, AMANDA G ([id number]) Microscopic Comment(continued) Breast prognostic studies are pending and  ASSESSMENT/PLAN    80 year old female with  #1 Ductal carcinoma in situ-stage 0 (Tis NX)  with comedonecrosis measuring 2.1 cm,ER positive, PR positive. Patient is status post lumpectomy on 11/04/2013.   #2 Makayla [name] completed radiation treatment on  02/03/2014  #3 Makayla [name] is taking Tamoxifen daily and tolerating it well.  I recommended she continue this daily.  We will see her back in 4-6 months, she is going to f/u with surgery in about one year.    #  4 We updated her health maintenance above. I recommended healthy diet, exercise, and monthly self breast exams.   All questions were answered. The patient knows to call the clinic with any problems, questions or concerns. We can certainly see the patient much sooner if necessary.  I spent 25 minutes counseling the patient face to face. The total time spent in the appointment was 53minutes.  Makayla [name], [place] [phone] 05/30/2014, 7:41 AM

## 2014-05-28 NOTE — Patient Instructions (Signed)
You are doing well.  You have no sign of recurrence.  Continue taking Tamoxifen daily.  You will see Dr. Brantley Stage in one year and we will see you back in 6 months.    Breast Self-Awareness Practicing breast self-awareness may pick up problems early, prevent significant medical complications, and possibly save your life. By practicing breast self-awareness, you can become familiar with how your breasts look and feel and if your breasts are changing. This allows you to notice changes early. It can also offer you some reassurance that your breast health is good. One way to learn what is normal for your breasts and whether your breasts are changing is to do a breast self-exam. If you find a lump or something that was not present in the past, it is best to contact your caregiver right away. Other findings that should be evaluated by your caregiver include nipple discharge, especially if it is bloody; skin changes or reddening; areas where the skin seems to be pulled in (retracted); or new lumps and bumps. Breast pain is seldom associated with cancer (malignancy), but should also be evaluated by a caregiver. HOW TO PERFORM A BREAST SELF-EXAM The best time to examine your breasts is 5-7 days after your menstrual period is over. During menstruation, the breasts are lumpier, and it may be more difficult to pick up changes. If you do not menstruate, have reached menopause, or had your uterus removed (hysterectomy), you should examine your breasts at regular intervals, such as monthly. If you are breastfeeding, examine your breasts after a feeding or after using a breast pump. Breast implants do not decrease the risk for lumps or tumors, so continue to perform breast self-exams as recommended. Talk to your caregiver about how to determine the difference between the implant and breast tissue. Also, talk about the amount of pressure you should use during the exam. Over time, you will become more familiar with the variations  of your breasts and more comfortable with the exam. A breast self-exam requires you to remove all your clothes above the waist. 1. Look at your breasts and nipples. Stand in front of a mirror in a room with good lighting. With your hands on your hips, push your hands firmly downward. Look for a difference in shape, contour, and size from one breast to the other (asymmetry). Asymmetry includes puckers, dips, or bumps. Also, look for skin changes, such as reddened or scaly areas on the breasts. Look for nipple changes, such as discharge, dimpling, repositioning, or redness. 2. Carefully feel your breasts. This is best done either in the shower or tub while using soapy water or when flat on your back. Place the arm (on the side of the breast you are examining) above your head. Use the pads (not the fingertips) of your three middle fingers on your opposite hand to feel your breasts. Start in the underarm area and use  inch (2 cm) overlapping circles to feel your breast. Use 3 different levels of pressure (light, medium, and firm pressure) at each circle before moving to the next circle. The light pressure is needed to feel the tissue closest to the skin. The medium pressure will help to feel breast tissue a little deeper, while the firm pressure is needed to feel the tissue close to the ribs. Continue the overlapping circles, moving downward over the breast until you feel your ribs below your breast. Then, move one finger-width towards the center of the body. Continue to use the  inch (2  cm) overlapping circles to feel your breast as you move slowly up toward the collar bone (clavicle) near the base of the neck. Continue the up and down exam using all 3 pressures until you reach the middle of the chest. Do this with each breast, carefully feeling for lumps or changes. 3.  Keep a written record with breast changes or normal findings for each breast. By writing this information down, you do not need to depend only  on memory for size, tenderness, or location. Write down where you are in your menstrual cycle, if you are still menstruating. Breast tissue can have some lumps or thick tissue. However, see your caregiver if you find anything that concerns you.  SEEK MEDICAL CARE IF:  You see a change in shape, contour, or size of your breasts or nipples.   You see skin changes, such as reddened or scaly areas on the breasts or nipples.   You have an unusual discharge from your nipples.   You feel a new lump or unusually thick areas.  Document Released: 10/15/2005 Document Revised: 10/01/2012 Document Reviewed: 01/30/2012 [facility] Patient Information 2015 [place], [place]. This information is not intended to replace advice given to you by your health care provider. Make sure you discuss any questions you have with your health care provider.

## 2014-06-07 DIAGNOSIS — H00029 Hordeolum internum unspecified eye, unspecified eyelid: Secondary | ICD-10-CM | POA: Diagnosis not present

## 2014-06-07 DIAGNOSIS — H11159 Pinguecula, unspecified eye: Secondary | ICD-10-CM | POA: Diagnosis not present

## 2014-06-07 DIAGNOSIS — H2510 Age-related nuclear cataract, unspecified eye: Secondary | ICD-10-CM | POA: Diagnosis not present

## 2014-08-02 DIAGNOSIS — E785 Hyperlipidemia, unspecified: Secondary | ICD-10-CM | POA: Diagnosis not present

## 2014-08-02 DIAGNOSIS — Z23 Encounter for immunization: Secondary | ICD-10-CM | POA: Diagnosis not present

## 2014-08-06 DIAGNOSIS — Z01419 Encounter for gynecological examination (general) (routine) without abnormal findings: Secondary | ICD-10-CM | POA: Diagnosis not present

## 2014-09-01 ENCOUNTER — Other Ambulatory Visit: Payer: Self-pay | Admitting: Specialist

## 2014-09-01 DIAGNOSIS — Z9889 Other specified postprocedural states: Secondary | ICD-10-CM

## 2014-09-01 DIAGNOSIS — Z853 Personal history of malignant neoplasm of breast: Secondary | ICD-10-CM

## 2014-09-08 ENCOUNTER — Other Ambulatory Visit: Payer: Self-pay | Admitting: *Deleted

## 2014-09-08 DIAGNOSIS — D0510 Intraductal carcinoma in situ of unspecified breast: Secondary | ICD-10-CM

## 2014-09-09 ENCOUNTER — Encounter: Payer: Self-pay | Admitting: Radiation Oncology

## 2014-09-09 ENCOUNTER — Other Ambulatory Visit (HOSPITAL_BASED_OUTPATIENT_CLINIC_OR_DEPARTMENT_OTHER): Payer: Medicare Other

## 2014-09-09 ENCOUNTER — Telehealth: Payer: Self-pay | Admitting: Hematology and Oncology

## 2014-09-09 ENCOUNTER — Ambulatory Visit
Admission: RE | Admit: 2014-09-09 | Discharge: 2014-09-09 | Disposition: A | Discharge status: Home / Self Care | Payer: Medicare Other | Source: Ambulatory Visit | Attending: Radiation Oncology | Admitting: Radiation Oncology

## 2014-09-09 ENCOUNTER — Ambulatory Visit (HOSPITAL_BASED_OUTPATIENT_CLINIC_OR_DEPARTMENT_OTHER): Payer: Medicare Other | Admitting: Hematology and Oncology

## 2014-09-09 VITALS — BP 156/83 | HR 66 | Temp 97.5°F | Resp 20 | Ht 66.0 in | Wt 159.3 lb

## 2014-09-09 VITALS — BP 142/62 | HR 64 | Temp 97.7°F | Resp 18 | Ht 66.0 in | Wt 159.3 lb

## 2014-09-09 DIAGNOSIS — D0510 Intraductal carcinoma in situ of unspecified breast: Secondary | ICD-10-CM

## 2014-09-09 DIAGNOSIS — Z170 Estrogen receptor positive status [ER+]: Secondary | ICD-10-CM

## 2014-09-09 DIAGNOSIS — D0512 Intraductal carcinoma in situ of left breast: Secondary | ICD-10-CM

## 2014-09-09 DIAGNOSIS — C50412 Malignant neoplasm of upper-outer quadrant of left female breast: Secondary | ICD-10-CM

## 2014-09-09 LAB — CBC WITH DIFFERENTIAL/PLATELET
BASO%: 0.5 % (ref 0.0–2.0)
Basophils Absolute: 0 10*3/uL (ref 0.0–0.1)
EOS%: 1.8 % (ref 0.0–7.0)
Eosinophils Absolute: 0.1 10*3/uL (ref 0.0–0.5)
HEMATOCRIT: 40.5 % (ref 34.8–46.6)
HGB: 13.3 g/dL (ref 11.6–15.9)
LYMPH%: 34.1 % (ref 14.0–49.7)
MCH: 30.8 pg (ref 25.1–34.0)
MCHC: 33 g/dL (ref 31.5–36.0)
MCV: 93.3 fL (ref 79.5–101.0)
MONO#: 0.6 10*3/uL (ref 0.1–0.9)
MONO%: 8.7 % (ref 0.0–14.0)
NEUT#: 3.8 10*3/uL (ref 1.5–6.5)
NEUT%: 54.9 % (ref 38.4–76.8)
Platelets: 272 10*3/uL (ref 145–400)
RBC: 4.33 10*6/uL (ref 3.70–5.45)
RDW: 13.1 % (ref 11.2–14.5)
WBC: 6.9 10*3/uL (ref 3.9–10.3)
lymph#: 2.4 10*3/uL (ref 0.9–3.3)

## 2014-09-09 LAB — COMPREHENSIVE METABOLIC PANEL (CC13)
ALBUMIN: 3.8 g/dL (ref 3.5–5.0)
ALT: 25 U/L (ref 0–55)
ANION GAP: 7 meq/L (ref 3–11)
AST: 30 U/L (ref 5–34)
Alkaline Phosphatase: 47 U/L (ref 40–150)
BUN: 18.8 mg/dL (ref 7.0–26.0)
CO2: 25 meq/L (ref 22–29)
CREATININE: 1 mg/dL (ref 0.6–1.1)
Calcium: 9.9 mg/dL (ref 8.4–10.4)
Chloride: 109 mEq/L (ref 98–109)
Glucose: 109 mg/dl (ref 70–140)
Potassium: 4.6 mEq/L (ref 3.5–5.1)
SODIUM: 142 meq/L (ref 136–145)
TOTAL PROTEIN: 7 g/dL (ref 6.4–8.3)
Total Bilirubin: 0.55 mg/dL (ref 0.20–1.20)

## 2014-09-09 NOTE — Progress Notes (Signed)
Benny Lennert here for follow up after treatment to her left breast.  She denies pain and fatigue.  She continues to take tamoxifen.  The skin on her left breast is intact.  She is using cocoa butter cream.

## 2014-09-09 NOTE — Assessment & Plan Note (Signed)
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 Patient is status post lumpectomy on 11/04/2013. Status post radiation for doesn't have been given tamoxifen started April 2015 plan is for 5 years  Surveillance: Today's breast exam was normal. She has a scheduled mammogram to be done November 30.  Survivorship:Discussed the importance of physical exercise in decreasing the likelihood of breast cancer recurrence. Recommended 30 mins daily 6 days a week of either brisk walking or cycling or swimming. Encouraged patient to eat more fruits and vegetables and decrease red meat. Also discussed the importance of continuing preventive care workup with her primary care physician.

## 2014-09-09 NOTE — Progress Notes (Signed)
Patient Care Team: Jamey Reas, MD as Referring Physician (Gynecology)  DIAGNOSIS: No matching staging information was found for the patient.  SUMMARY OF ONCOLOGIC HISTORY:   Breast cancer of upper-outer quadrant of left female breast (Resolved)   11/30/2013 Initial Diagnosis Breast cancer of upper-outer quadrant of left female breast  Makayla [name] is a 80 y.o. female. Had a mammogram performed which was abnormal. She was noted to have abnormal calcifications within the upper-outer quadrant of the left breast. Stereotactic biopsy was attempted but could not be done. Therefore patient was seen by Dr. Erroll Luna and she underwent a left breast lumpectomy with a wire localization. The final pathology revealed intermediate grade ductal carcinoma in situ with comedonecrosis and calcifications. The area was measured at 2.1 cm. The anterior margin was initially focally positive she had a reexcision that showed no residual tumor. The tumor was estrogen receptor +100% progesterone receptor +96%. Postoperatively patient has done well. She was seen by Dr. Gery Pray in radiation oncology. He did recommend radiation therapy adjuvantly. She was started on daily Tamoxifen in April, 2015.  CHIEF COMPLIANT: followup of DCIS  INTERVAL HISTORY: Makayla [name] is a 80 year old Caucasian lady with above-mentioned history of DCIS that was treated with lumpectomy followed by radiation therapy. She was started tamoxifen in April 2015. She is tolerating it very well without any major problems or concerns other than occasional hot flashes. She denies any muscle aches or pains. She continues to follow with her primary care physician and gynecologist. She denies any new lumps or nodules.   REVIEW OF SYSTEMS:   Constitutional: Denies fevers, chills or abnormal weight loss Eyes: Denies blurriness of vision Ears, nose, mouth, throat, and face: Denies mucositis or sore throat Respiratory: Denies cough, dyspnea or  wheezes Cardiovascular: Denies palpitation, chest discomfort or lower extremity swelling Gastrointestinal:  Denies nausea, heartburn or change in bowel habits Skin: Denies abnormal skin rashes Lymphatics: Denies new lymphadenopathy or easy bruising Neurological:Denies numbness, tingling or new weaknesses Behavioral/Psych: Mood is stable, no new changes  Breast:  denies any pain or lumps or nodules in either breasts All other systems were reviewed with the patient and are negative.  I have reviewed the past medical history, past surgical history, social history and family history with the patient and they are unchanged from previous note.  ALLERGIES:  is allergic to codeine and sulfa antibiotics.  MEDICATIONS:  Current Outpatient Prescriptions  Medication Sig Dispense Refill  . aspirin 81 MG tablet Take 81 mg by mouth daily.    Marland Kitchen atorvastatin (LIPITOR) 10 MG tablet Take 10 mg by mouth daily.    . Calcium-Vitamin D-Vitamin K (VIACTIV PO) Take 1,000 mg by mouth daily.    Marland Kitchen loratadine (CLARITIN) 10 MG tablet Take 10 mg by mouth daily.    . Multiple Vitamin (MULTIVITAMIN) tablet Take 1 tablet by mouth daily.    . niacin 250 MG tablet Take 250 mg by mouth at bedtime.    . Omega-3 Fatty Acids (FISH OIL) 1000 MG CAPS Take by mouth daily.    . tamoxifen (NOLVADEX) 20 MG tablet Take 1 tablet (20 mg total) by mouth daily. 90 tablet 3   No current facility-administered medications for this visit.    PHYSICAL EXAMINATION: ECOG PERFORMANCE STATUS: 0 - Asymptomatic  Filed Vitals:   09/09/14 1156  BP: 142/62  Pulse: 64  Temp: 97.7 F (36.5 C)  Resp: 18   Filed Weights   09/09/14 1156  Weight: 159 lb 4.8 oz (72.258  kg)    GENERAL:alert, no distress and comfortable SKIN: skin color, texture, turgor are normal, no rashes or significant lesions EYES: normal, Conjunctiva are pink and non-injected, sclera clear OROPHARYNX:no exudate, no erythema and lips, buccal mucosa, and tongue normal   NECK: supple, thyroid normal size, non-tender, without nodularity LYMPH:  no palpable lymphadenopathy in the cervical, axillary or inguinal LUNGS: clear to auscultation and percussion with normal breathing effort HEART: regular rate & rhythm and no murmurs and no lower extremity edema ABDOMEN:abdomen soft, non-tender and normal bowel sounds Musculoskeletal:no cyanosis of digits and no clubbing  NEURO: alert & oriented x 3 with fluent speech, no focal motor/sensory deficits BREAST: No palpable masses or nodules in either right or left breasts. No palpable axillary supraclavicular or infraclavicular adenopathy no breast tenderness or nipple discharge.   LABORATORY DATA:  I have reviewed the data as listed   Chemistry      Component Value Date/Time   NA 142 09/09/2014 0951   K 4.6 09/09/2014 0951   CO2 25 09/09/2014 0951   BUN 18.8 09/09/2014 0951   CREATININE 1.0 09/09/2014 0951      Component Value Date/Time   CALCIUM 9.9 09/09/2014 0951   ALKPHOS 47 09/09/2014 0951   AST 30 09/09/2014 0951   ALT 25 09/09/2014 0951   BILITOT 0.55 09/09/2014 0951       Lab Results  Component Value Date   WBC 6.9 09/09/2014   HGB 13.3 09/09/2014   HCT 40.5 09/09/2014   MCV 93.3 09/09/2014   PLT 272 09/09/2014   NEUTROABS 3.8 09/09/2014     ASSESSMENT & PLAN:  DCIS (ductal carcinoma in situ) 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 Patient is status post lumpectomy on 11/04/2013. Status post radiation for doesn't have been given tamoxifen started April 2015 plan is for 5 years  Surveillance: Today's breast exam was normal. She has a scheduled mammogram to be done November 30.  Survivorship:Discussed the importance of physical exercise in decreasing the likelihood of breast cancer recurrence. Recommended 30 mins daily 6 days a week of either brisk walking or cycling or swimming. Encouraged patient to eat more fruits and  vegetables and decrease red meat. Also discussed the importance of continuing preventive care workup with her primary care physician.    No orders of the defined types were placed in this encounter.   The patient has a good understanding of the overall plan. she agrees with it. She will call with any problems that may develop before her next visit here.  I spent 15 minutes counseling the patient face to face. The total time spent in the appointment was 20 minutes and more than 50% was on counseling and review of test results    Rulon Eisenmenger, MD 09/09/2014 12:26 PM

## 2014-09-09 NOTE — Progress Notes (Signed)
  Radiation Oncology         [phone] ________________________________  Name: Makayla [name] MRN: [id number]  Date: 09/09/2014  DOB: Nov 27, 1933  Follow-Up Visit Note  CC: No primary care provider on file.  Erroll Luna, MD    ICD-9-CM ICD-10-CM   1. Breast cancer of upper-outer quadrant of left female breast 174.4 C50.412     Diagnosis:   Intraductal carcinoma of the left breast  Interval Since Last Radiation:  9  months  Narrative:  The patient returns today for routine follow-up.  She is doing well and without complaints. She has hot flashes but these do not seem to bother her.  She continues on tamoxifen. Patient denies any pain within the left breast area nipple discharge or bleeding. She denies any swelling in her left arm or hand. Patient denies any new bony pain headaches dizziness or blurred vision.                              ALLERGIES:  is allergic to codeine and sulfa antibiotics.  Meds: Current Outpatient Prescriptions  Medication Sig Dispense Refill  . aspirin 81 MG tablet Take 81 mg by mouth daily.    Marland Kitchen atorvastatin (LIPITOR) 10 MG tablet Take 10 mg by mouth daily.    . Calcium-Vitamin D-Vitamin K (VIACTIV PO) Take 1,000 mg by mouth daily.    Marland Kitchen loratadine (CLARITIN) 10 MG tablet Take 10 mg by mouth daily.    . Multiple Vitamin (MULTIVITAMIN) tablet Take 1 tablet by mouth daily.    . niacin 250 MG tablet Take 250 mg by mouth at bedtime.    . Omega-3 Fatty Acids (FISH OIL) 1000 MG CAPS Take by mouth daily.    . tamoxifen (NOLVADEX) 20 MG tablet Take 1 tablet (20 mg total) by mouth daily. 90 tablet 3   No current facility-administered medications for this encounter.    Physical Findings: The patient is in no acute distress. Patient is alert and oriented.  height is 5\' 6"  (1.676 m) and weight is 159 lb 4.8 oz (72.258 kg). Her oral temperature is 97.5 F (36.4 C). Her blood pressure is 156/83 and her pulse is 66. Her respiration is 20. Marland Kitchen No palpable  supraclavicular or axillary adenopathy. Lungs clear to auscultation. The heart has a regular rhythm and rate. Examination of the right breast reveals no mass or nipple discharge. Examination of the left breast reveals mild edema in the nipple areolar complex area. No palpable or visible signs of recurrence. No nipple discharge or bleeding  Lab Findings: Lab Results  Component Value Date   WBC 6.9 09/09/2014   HGB 13.3 09/09/2014   HCT 40.5 09/09/2014   MCV 93.3 09/09/2014   PLT 272 09/09/2014    Radiographic Findings: No results found.  Impression:  No evidence of recurrence on clinical exam today  Plan:  When necessary followup in radiation oncology. Patient will continue close followup with medical oncology. She is scheduled for mammograms and medical oncology followup later this month.  ____________________________________ Blair Promise, MD

## 2014-09-09 NOTE — Telephone Encounter (Signed)
per pof to sch pt appt-gave pt copy of sch °

## 2014-09-27 ENCOUNTER — Ambulatory Visit
Admission: RE | Admit: 2014-09-27 | Discharge: 2014-09-27 | Disposition: A | Discharge status: Home / Self Care | Payer: Medicare Other | Source: Ambulatory Visit | Attending: Specialist | Admitting: Specialist

## 2014-09-27 DIAGNOSIS — Z9889 Other specified postprocedural states: Secondary | ICD-10-CM

## 2014-09-27 DIAGNOSIS — Z853 Personal history of malignant neoplasm of breast: Secondary | ICD-10-CM

## 2014-12-02 DIAGNOSIS — L400 Psoriasis vulgaris: Secondary | ICD-10-CM | POA: Diagnosis not present

## 2014-12-02 DIAGNOSIS — Z09 Encounter for follow-up examination after completed treatment for conditions other than malignant neoplasm: Secondary | ICD-10-CM | POA: Diagnosis not present

## 2014-12-02 DIAGNOSIS — L570 Actinic keratosis: Secondary | ICD-10-CM | POA: Diagnosis not present

## 2014-12-02 DIAGNOSIS — Z872 Personal history of diseases of the skin and subcutaneous tissue: Secondary | ICD-10-CM | POA: Diagnosis not present

## 2014-12-02 DIAGNOSIS — X32XXXA Exposure to sunlight, initial encounter: Secondary | ICD-10-CM | POA: Diagnosis not present

## 2015-01-18 ENCOUNTER — Other Ambulatory Visit: Payer: Self-pay | Admitting: Oncology

## 2015-02-28 DIAGNOSIS — Z86000 Personal history of in-situ neoplasm of breast: Secondary | ICD-10-CM | POA: Diagnosis not present

## 2015-03-14 NOTE — Assessment & Plan Note (Signed)
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 Patient is status post lumpectomy on 11/04/2013. Status post radiation for doesn't have been given tamoxifen started April 2015 plan is for 5 years  Tamoxifen Toxicities:  Breast Cancer Surveillance: 1. Breast exam 03/15/15: Normal 2. Mammogram 09/27/14 No abnormalities. Postsurgical changes. Breast Density Category B. I recommended that she get 3-D mammograms for surveillance. Discussed the differences between different breast density categories.   RTC in 6 months

## 2015-03-15 ENCOUNTER — Telehealth: Payer: Self-pay | Admitting: Hematology and Oncology

## 2015-03-15 ENCOUNTER — Ambulatory Visit (HOSPITAL_BASED_OUTPATIENT_CLINIC_OR_DEPARTMENT_OTHER): Payer: Medicare Other | Admitting: Hematology and Oncology

## 2015-03-15 VITALS — BP 148/67 | HR 83 | Temp 97.7°F | Resp 18 | Ht 66.0 in | Wt 156.0 lb

## 2015-03-15 DIAGNOSIS — C50412 Malignant neoplasm of upper-outer quadrant of left female breast: Secondary | ICD-10-CM

## 2015-03-15 DIAGNOSIS — Z170 Estrogen receptor positive status [ER+]: Secondary | ICD-10-CM | POA: Diagnosis not present

## 2015-03-15 NOTE — Progress Notes (Signed)
Patient Care Team: Jamey Reas, MD as Referring Physician (Gynecology)  DIAGNOSIS: No matching staging information was found for the patient.  SUMMARY OF ONCOLOGIC HISTORY:   Breast cancer of upper-outer quadrant of left female breast (Resolved)   12/08/2013 Surgery intermediate grade DCIS with comedonecrosis and calcifications. 2.1 cm. The anterior margin focally positive reexcision that showed no residual tumor. Er100% PR 96%   01/11/2014 - 02/19/2014 Radiation Therapy Adjuvant XRT   02/25/2014 -  Anti-estrogen oral therapy Tamoxifen 20 mg daily    CHIEF COMPLIANT: Follow-up of breast cancer on tamoxifen  INTERVAL HISTORY: Makayla [name] is a 81 year old with above-mentioned history of DCIS of the left breast currently on adjuvant antiestrogen therapy with tamoxifen. She is tolerating it extremely well without any major problems. She does have leg cramps which wake her up at night as well as occasional hot flashes. She stays very active and does aerobics twice a week.   REVIEW OF SYSTEMS:   Constitutional: Denies fevers, chills or abnormal weight loss Eyes: Denies blurriness of vision Ears, nose, mouth, throat, and face: Denies mucositis or sore throat Respiratory: Denies cough, dyspnea or wheezes Cardiovascular: Denies palpitation, chest discomfort or lower extremity swelling Gastrointestinal:  Denies nausea, heartburn or change in bowel habits Skin: Denies abnormal skin rashes Lymphatics: Denies new lymphadenopathy or easy bruising Neurological:Denies numbness, tingling or new weaknesses Behavioral/Psych: Mood is stable, no new changes  Breast:  denies any pain or lumps or nodules in either breasts All other systems were reviewed with the patient and are negative.  I have reviewed the past medical history, past surgical history, social history and family history with the patient and they are unchanged from previous note.  ALLERGIES:  is allergic to codeine and sulfa  antibiotics.  MEDICATIONS:  Current Outpatient Prescriptions  Medication Sig Dispense Refill  . aspirin 81 MG tablet Take 81 mg by mouth daily.    Marland Kitchen atorvastatin (LIPITOR) 10 MG tablet Take 10 mg by mouth daily.    . Calcium-Vitamin D-Vitamin K (VIACTIV PO) Take 1,000 mg by mouth daily.    Marland Kitchen loratadine (CLARITIN) 10 MG tablet Take 10 mg by mouth daily.    . Multiple Vitamin (MULTIVITAMIN) tablet Take 1 tablet by mouth daily.    . niacin 250 MG tablet Take 250 mg by mouth at bedtime.    . Omega-3 Fatty Acids (FISH OIL) 1000 MG CAPS Take by mouth daily.    . tamoxifen (NOLVADEX) 20 MG tablet Take 1 tablet (20 mg total) by mouth daily. 90 tablet 3   No current facility-administered medications for this visit.    PHYSICAL EXAMINATION: ECOG PERFORMANCE STATUS: 0 - Asymptomatic  There were no vitals filed for this visit. There were no vitals filed for this visit.  GENERAL:alert, no distress and comfortable SKIN: skin color, texture, turgor are normal, no rashes or significant lesions EYES: normal, Conjunctiva are pink and non-injected, sclera clear OROPHARYNX:no exudate, no erythema and lips, buccal mucosa, and tongue normal  NECK: supple, thyroid normal size, non-tender, without nodularity LYMPH:  no palpable lymphadenopathy in the cervical, axillary or inguinal LUNGS: clear to auscultation and percussion with normal breathing effort HEART: regular rate & rhythm and no murmurs and no lower extremity edema ABDOMEN:abdomen soft, non-tender and normal bowel sounds Musculoskeletal:no cyanosis of digits and no clubbing  NEURO: alert & oriented x 3 with fluent speech, no focal motor/sensory deficits BREAST: No palpable masses or nodules in either right or left breasts. No palpable axillary supraclavicular or infraclavicular adenopathy  no breast tenderness or nipple discharge. (exam performed in the presence of a chaperone)  LABORATORY DATA:  I have reviewed the data as listed   Chemistry       Component Value Date/Time   NA 142 09/09/2014 0951   K 4.6 09/09/2014 0951   CO2 25 09/09/2014 0951   BUN 18.8 09/09/2014 0951   CREATININE 1.0 09/09/2014 0951      Component Value Date/Time   CALCIUM 9.9 09/09/2014 0951   ALKPHOS 47 09/09/2014 0951   AST 30 09/09/2014 0951   ALT 25 09/09/2014 0951   BILITOT 0.55 09/09/2014 0951       Lab Results  Component Value Date   WBC 6.9 09/09/2014   HGB 13.3 09/09/2014   HCT 40.5 09/09/2014   MCV 93.3 09/09/2014   PLT 272 09/09/2014   NEUTROABS 3.8 09/09/2014     RADIOGRAPHIC STUDIES: I have personally reviewed the radiology reports and agreed with their findings. 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 Patient is status post lumpectomy on 11/04/2013. Status post radiation for doesn't have been given tamoxifen started April 2015 plan is for 5 years  Tamoxifen Toxicities: 1. Leg cramps 2. Occasional hot flashes    Breast Cancer Surveillance: 1. Breast exam 03/15/15: Normal 2. Mammogram 09/27/14 No abnormalities. Postsurgical changes. Breast Density Category B. I recommended that she get 3-D mammograms for surveillance. Discussed the differences between different breast density categories.   RTC in 6 months  No orders of the defined types were placed in this encounter.   The patient has a good understanding of the overall plan. she agrees with it. she will call with any problems that may develop before the next visit here.   Rulon Eisenmenger, MD

## 2015-03-15 NOTE — Telephone Encounter (Signed)
Gave patient avs report and appointment for November. °

## 2015-03-15 NOTE — Addendum Note (Signed)
Addended by: Prentiss Bells on: 03/15/2015 09:38 AM   Modules accepted: Medications

## 2015-03-21 DIAGNOSIS — H02054 Trichiasis without entropian left upper eyelid: Secondary | ICD-10-CM | POA: Diagnosis not present

## 2015-03-30 DIAGNOSIS — H02054 Trichiasis without entropian left upper eyelid: Secondary | ICD-10-CM | POA: Diagnosis not present

## 2015-06-02 DIAGNOSIS — Z09 Encounter for follow-up examination after completed treatment for conditions other than malignant neoplasm: Secondary | ICD-10-CM | POA: Diagnosis not present

## 2015-06-02 DIAGNOSIS — X32XXXA Exposure to sunlight, initial encounter: Secondary | ICD-10-CM | POA: Diagnosis not present

## 2015-06-02 DIAGNOSIS — Z872 Personal history of diseases of the skin and subcutaneous tissue: Secondary | ICD-10-CM | POA: Diagnosis not present

## 2015-06-02 DIAGNOSIS — L570 Actinic keratosis: Secondary | ICD-10-CM | POA: Diagnosis not present

## 2015-06-02 DIAGNOSIS — L400 Psoriasis vulgaris: Secondary | ICD-10-CM | POA: Diagnosis not present

## 2015-06-02 DIAGNOSIS — Z7189 Other specified counseling: Secondary | ICD-10-CM | POA: Diagnosis not present

## 2015-06-02 DIAGNOSIS — D485 Neoplasm of uncertain behavior of skin: Secondary | ICD-10-CM | POA: Diagnosis not present

## 2015-06-10 DIAGNOSIS — H00021 Hordeolum internum right upper eyelid: Secondary | ICD-10-CM | POA: Diagnosis not present

## 2015-06-10 DIAGNOSIS — H25813 Combined forms of age-related cataract, bilateral: Secondary | ICD-10-CM | POA: Diagnosis not present

## 2015-06-10 DIAGNOSIS — H02834 Dermatochalasis of left upper eyelid: Secondary | ICD-10-CM | POA: Diagnosis not present

## 2015-07-18 IMAGING — MG MM DIGITAL SCREENING BILAT W/ CAD
12 series · 12 of 28 positions shown · non-contrast
Comparison: Previous Exam(s)

CLINICAL DATA: Screening.

EXAM:
DIGITAL SCREENING BILATERAL MAMMOGRAM WITH CAD
DIGITAL BREAST TOMOSYNTHESIS
Digital breast tomosynthesis images are acquired in two projections.
These images are reviewed in combination with the digital mammogram,
confirming the findings below.

[L CC]
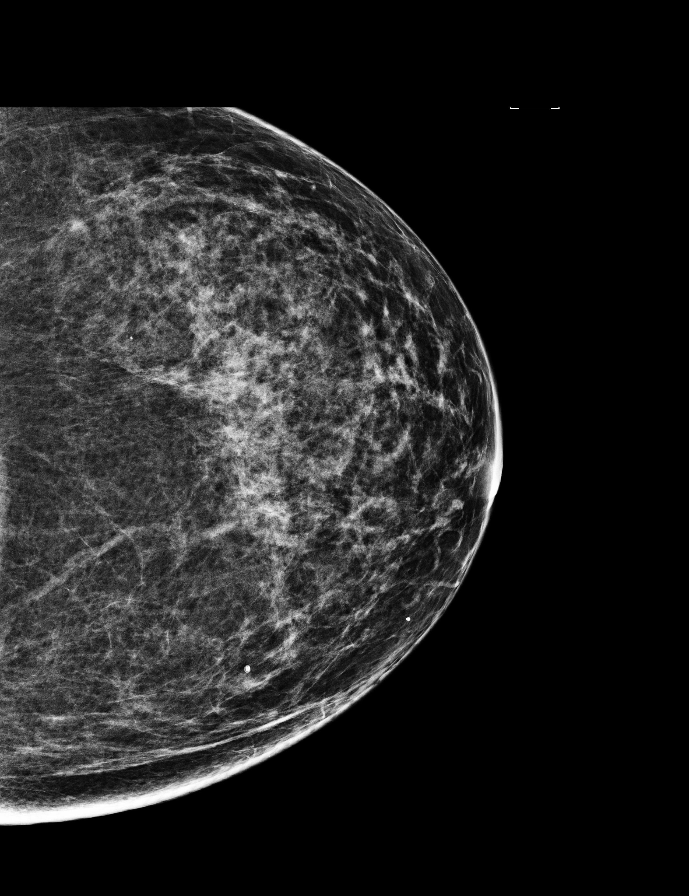

[L MLO]
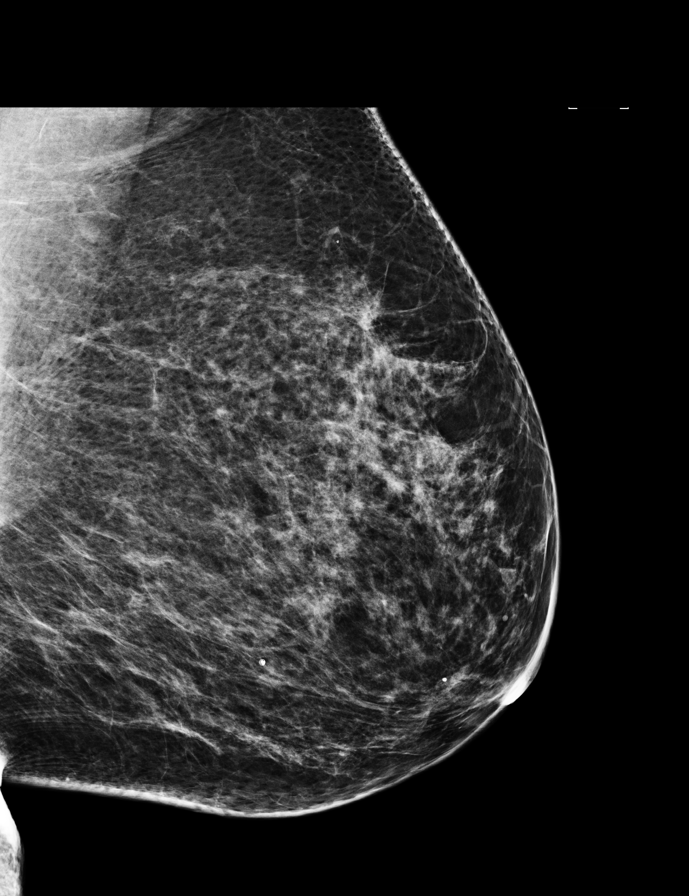

[R CC]
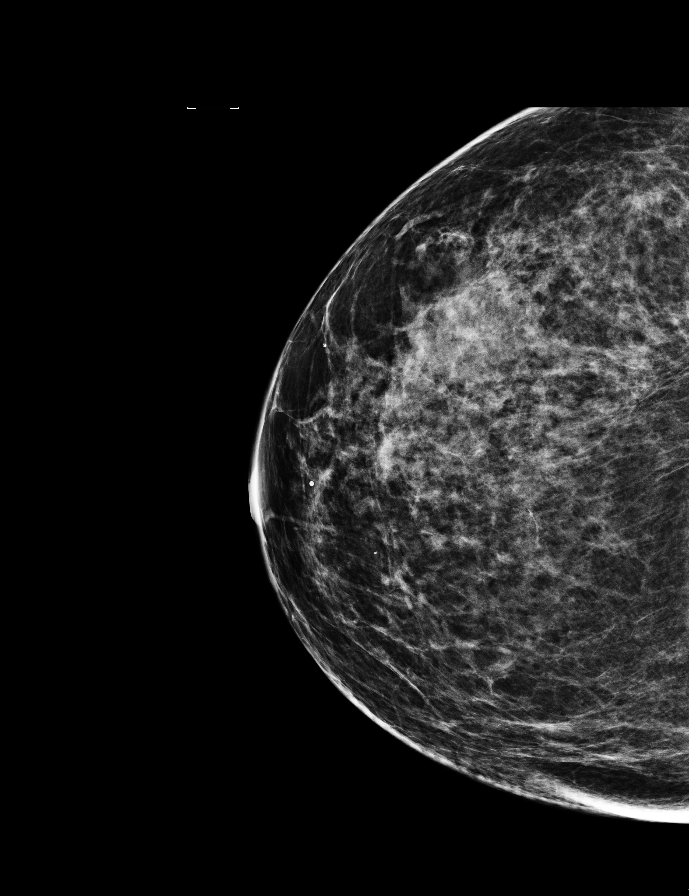

[R MLO]
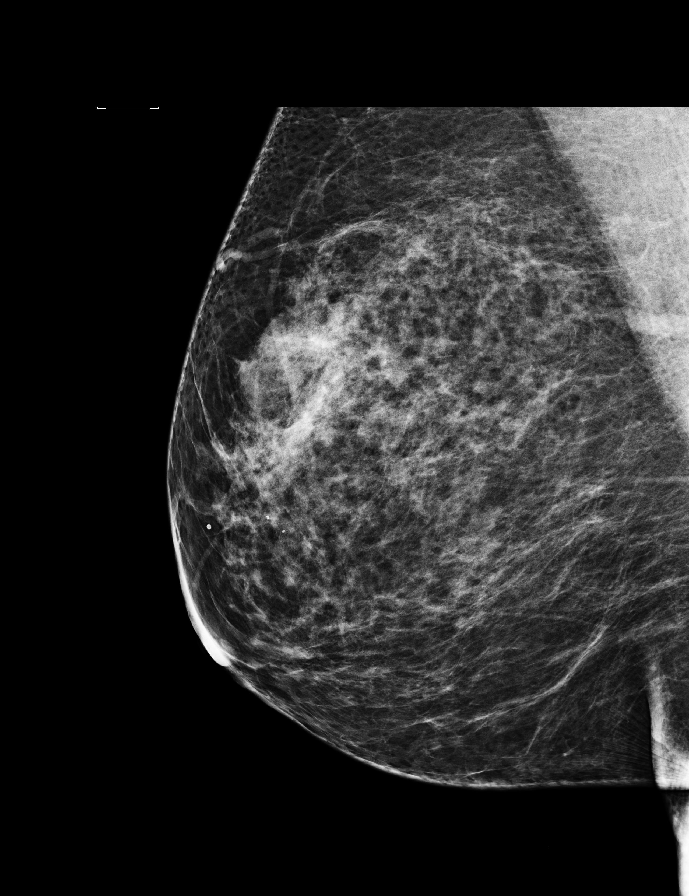

[R CC tomo (1 of 2)]
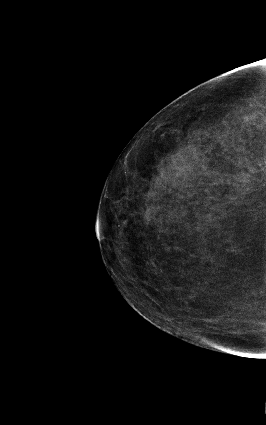

[L MLO tomo (1 of 2)]
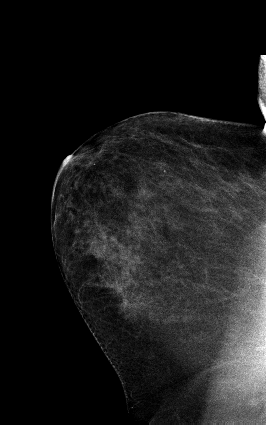

[L CC tomo (1 of 2)]
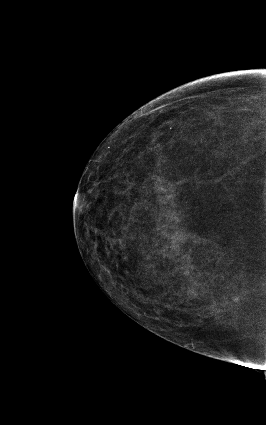

[R MLO tomo (1 of 2)]
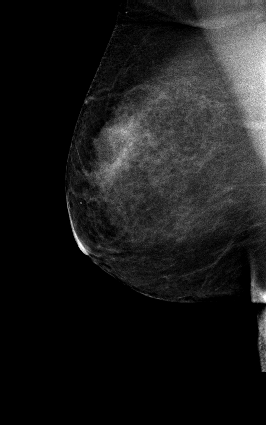

[R MLO tomo (2 of 2) · tomo slice 45/88.0]
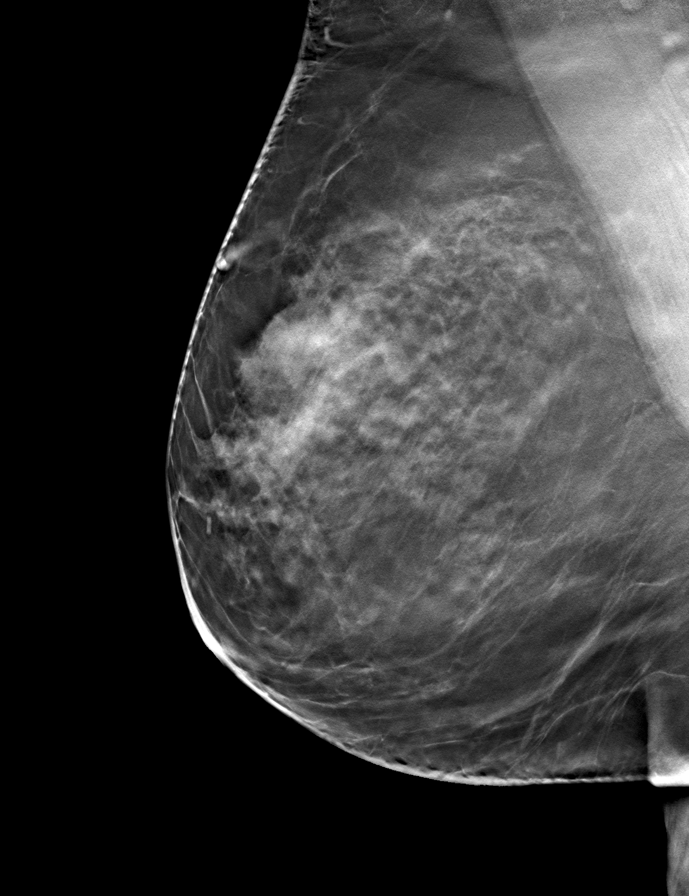

[L CC tomo (2 of 2) · tomo slice 43/84.0]
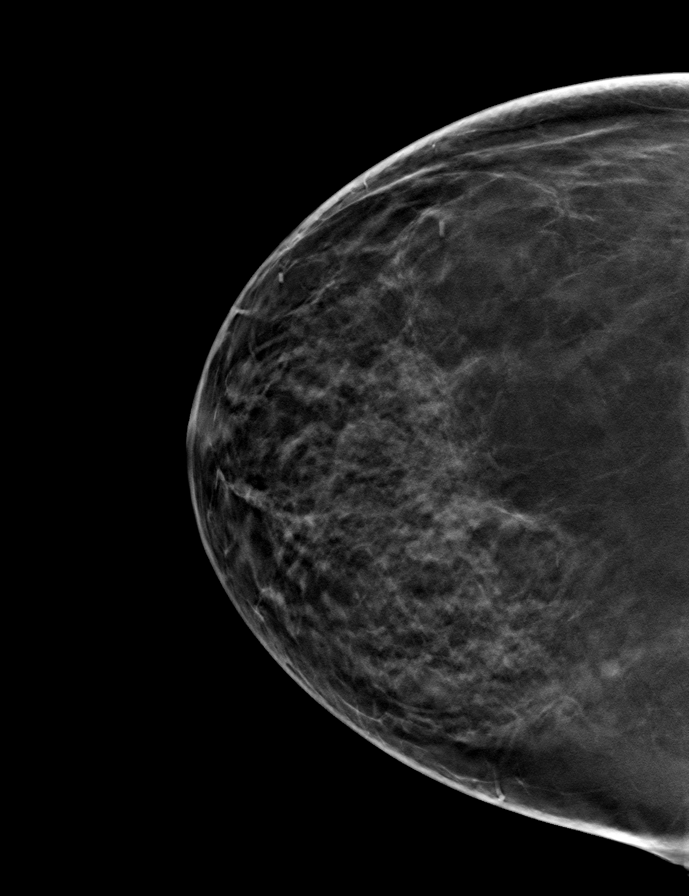

[R CC tomo (2 of 2) · tomo slice 43/84.0]
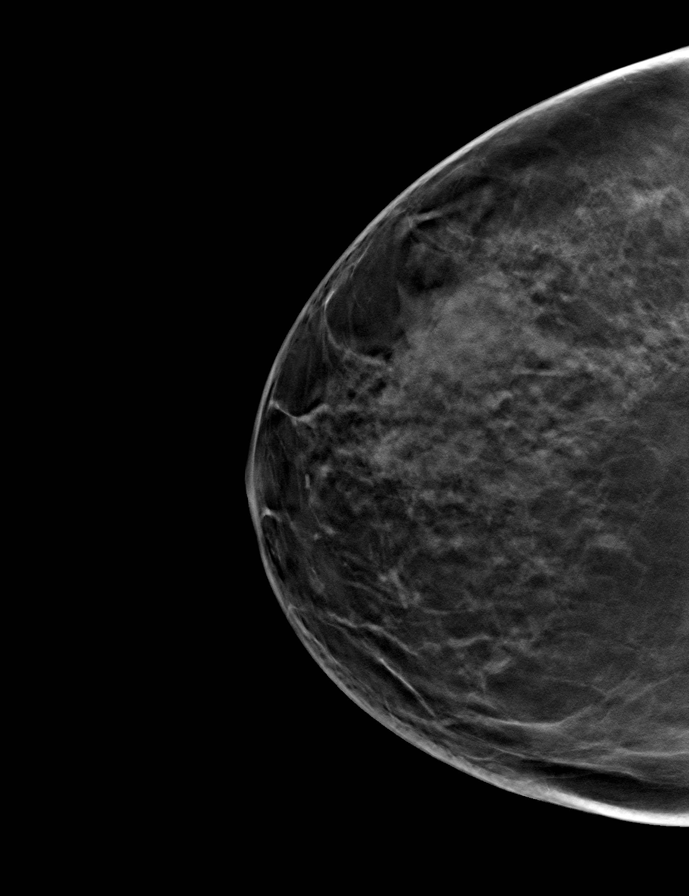

[L MLO tomo (2 of 2) · tomo slice 43/85.0]
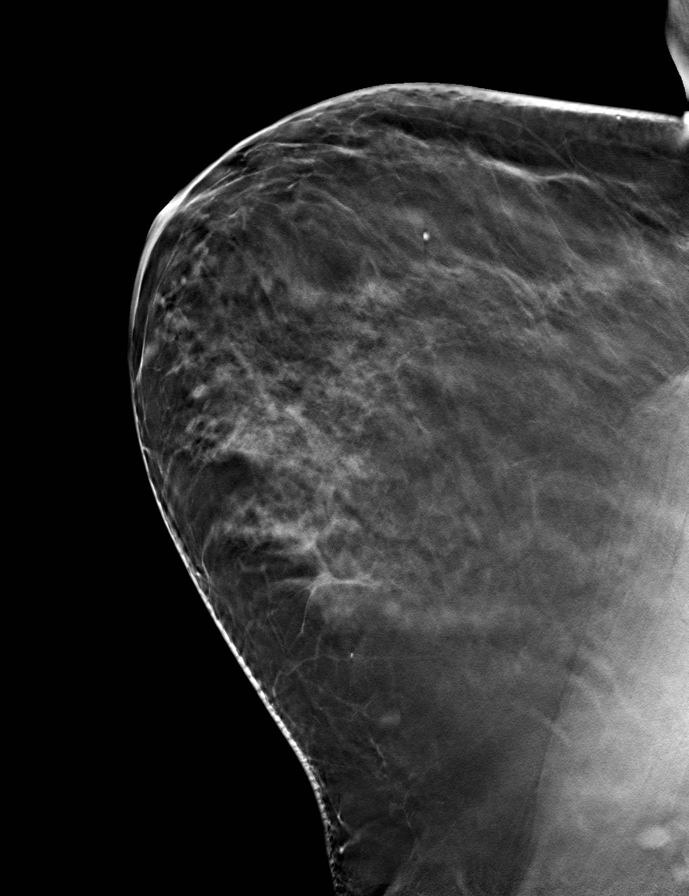

[12 of 28 positions shown; findings below may reference images not displayed]

ACR Breast Density Category b: There are scattered areas of
fibroglandular density.
FINDINGS: In the left breast, calcifications warrant further evaluation with
magnified views. In the right breast, no mass or malignant type
calcifications are identified. Images were processed with CAD.
IMPRESSION: Further evaluation is suggested for calcifications in the left
breast.

RECOMMENDATION:
Diagnostic mammogram of the left breast. (Code:[id number])

The patient will be contacted regarding the findings, and additional
imaging will be scheduled.

BI-RADS CATEGORY  0. Incomplete. Need additional imaging evaluation
and/or prior mammograms for comparison.

## 2015-08-01 DIAGNOSIS — E785 Hyperlipidemia, unspecified: Secondary | ICD-10-CM | POA: Diagnosis not present

## 2015-08-09 IMAGING — MG MM DIGITAL DIAGNOSTIC LIMITED*L*
4 series · 4 of 4 positions shown · non-contrast
Comparison: Previous examinations, including the screening
mammogram with digital breast tomosynthesis dated 09/07/2013.

ADDENDUM:
The microcalcifications deep in the upper outer left breast could
not be adequately visualized on the stereotactic table to perform a
stereotactic guided core needle biopsy. Therefore, left breast
mammographic needle localization and surgical excision of the
group of microcalcifications is recommended. The patient requested
an appointment with a general surgeon in [HOSPITAL], [place] [facility]].
Therefore, she was given an appointment with Dr. Disani at 11 a.m.
on 10/06/2013.
CLINICAL DATA: Left breast calcifications at recent screening
mammography.

EXAM:
DIGITAL DIAGNOSTIC  LEFT MAMMOGRAM

[L CC (1 of 2)]
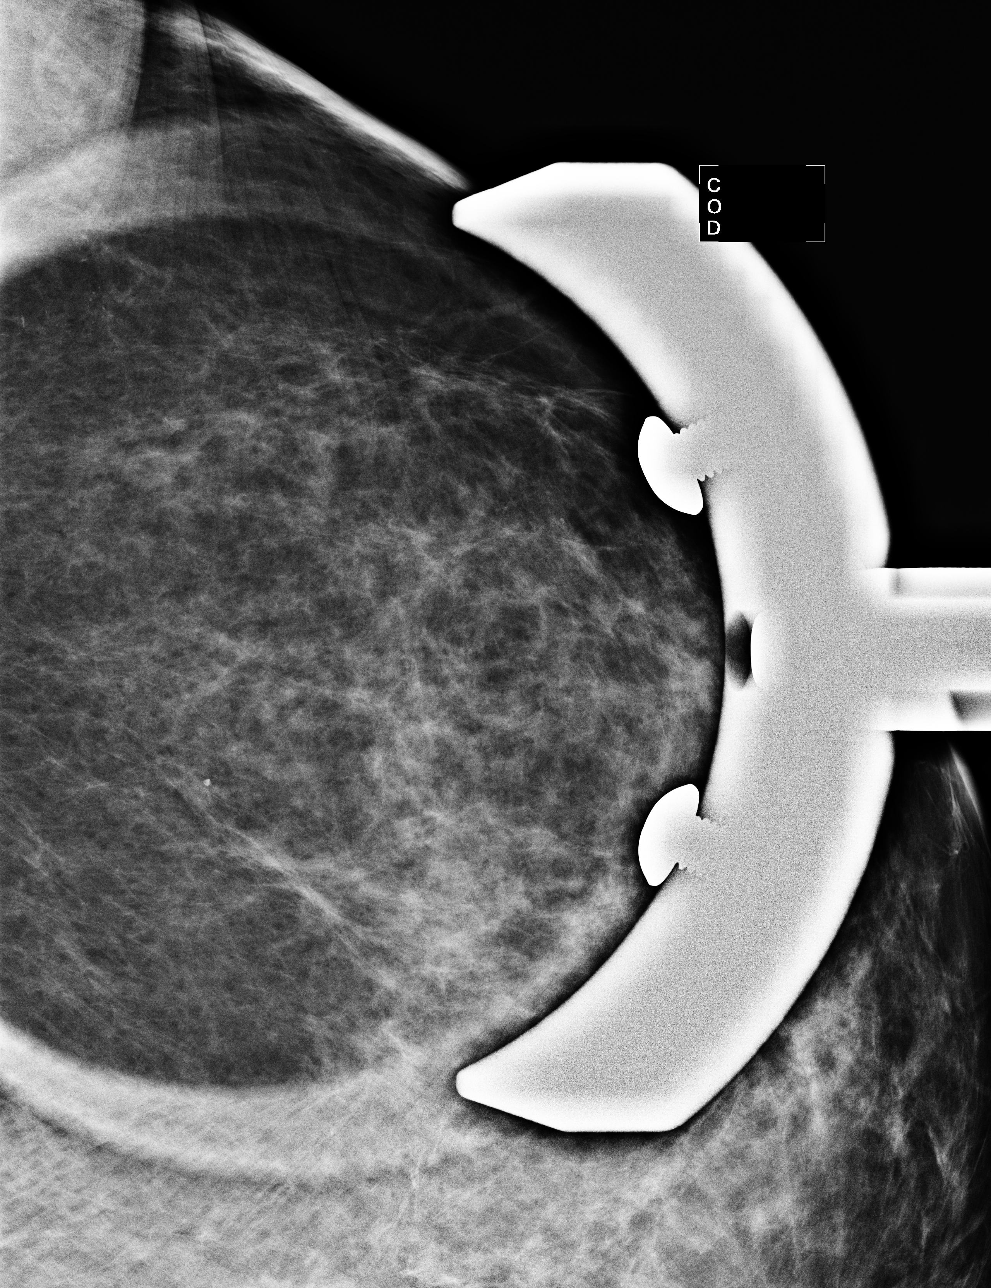

[L ML (1 of 2)]
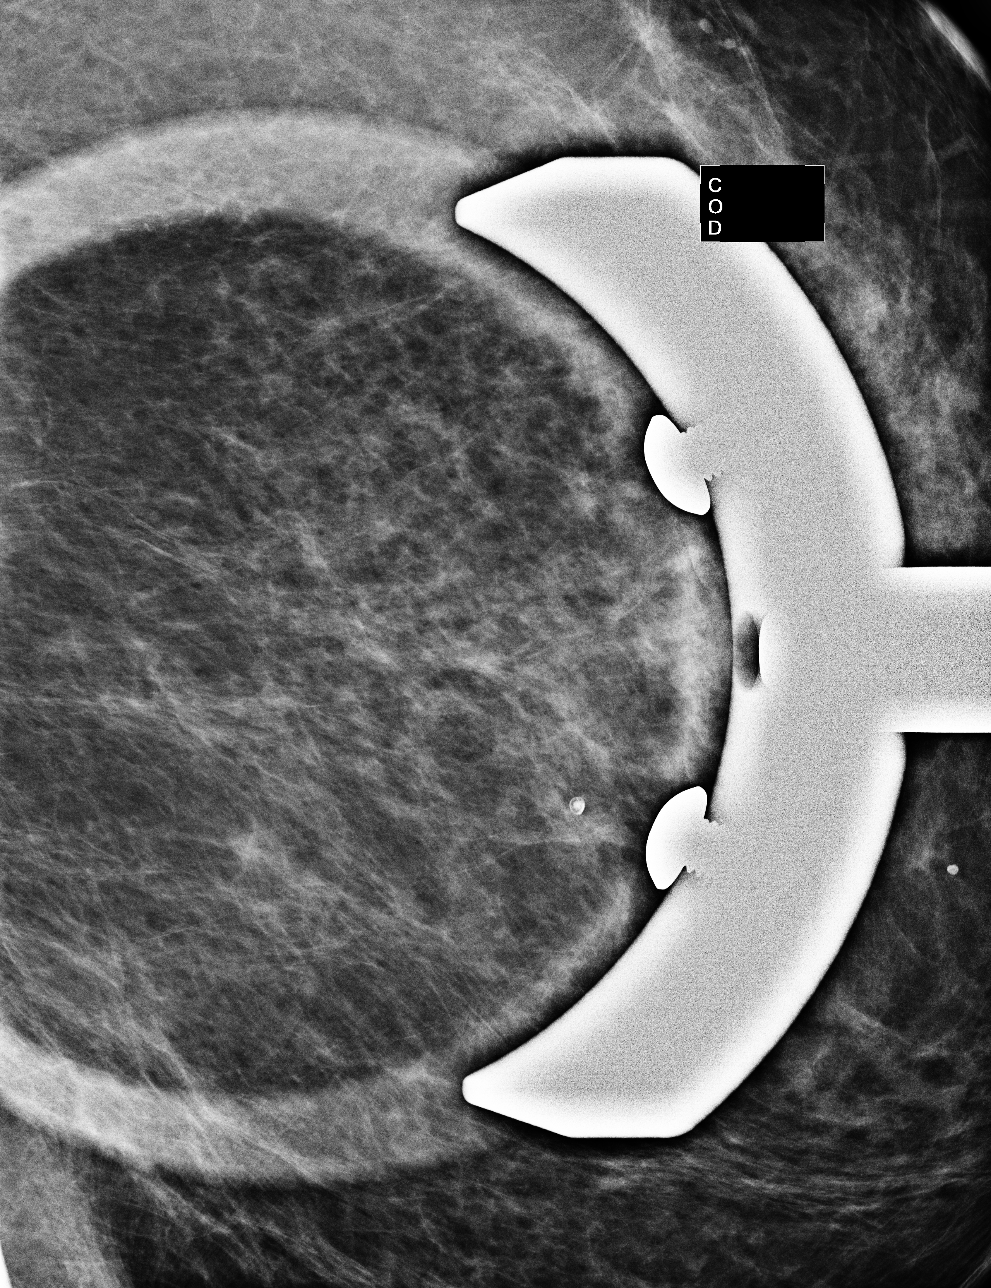

[L CC (2 of 2)]
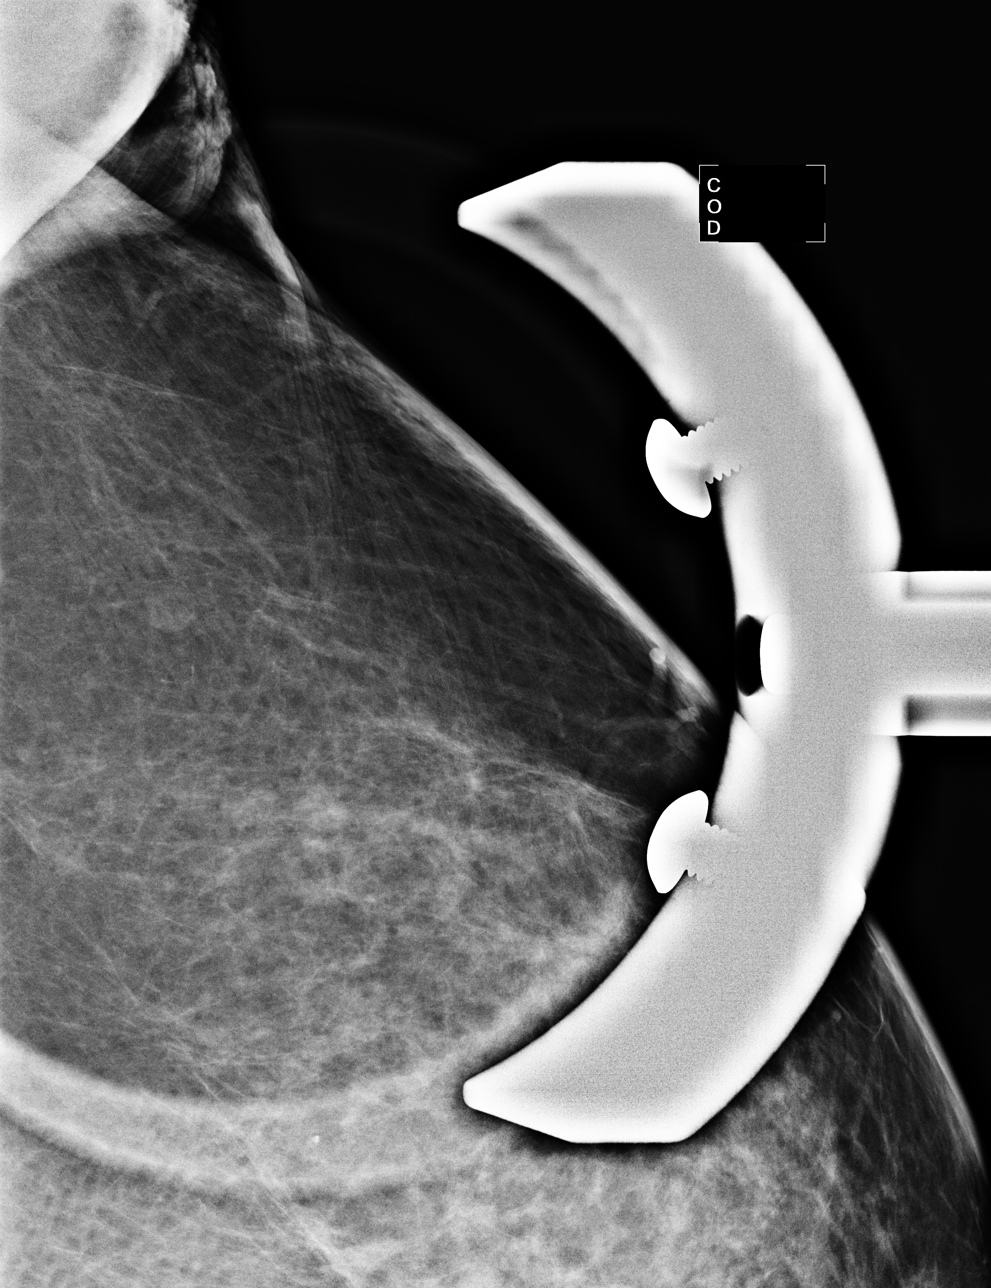

[L ML (2 of 2)]
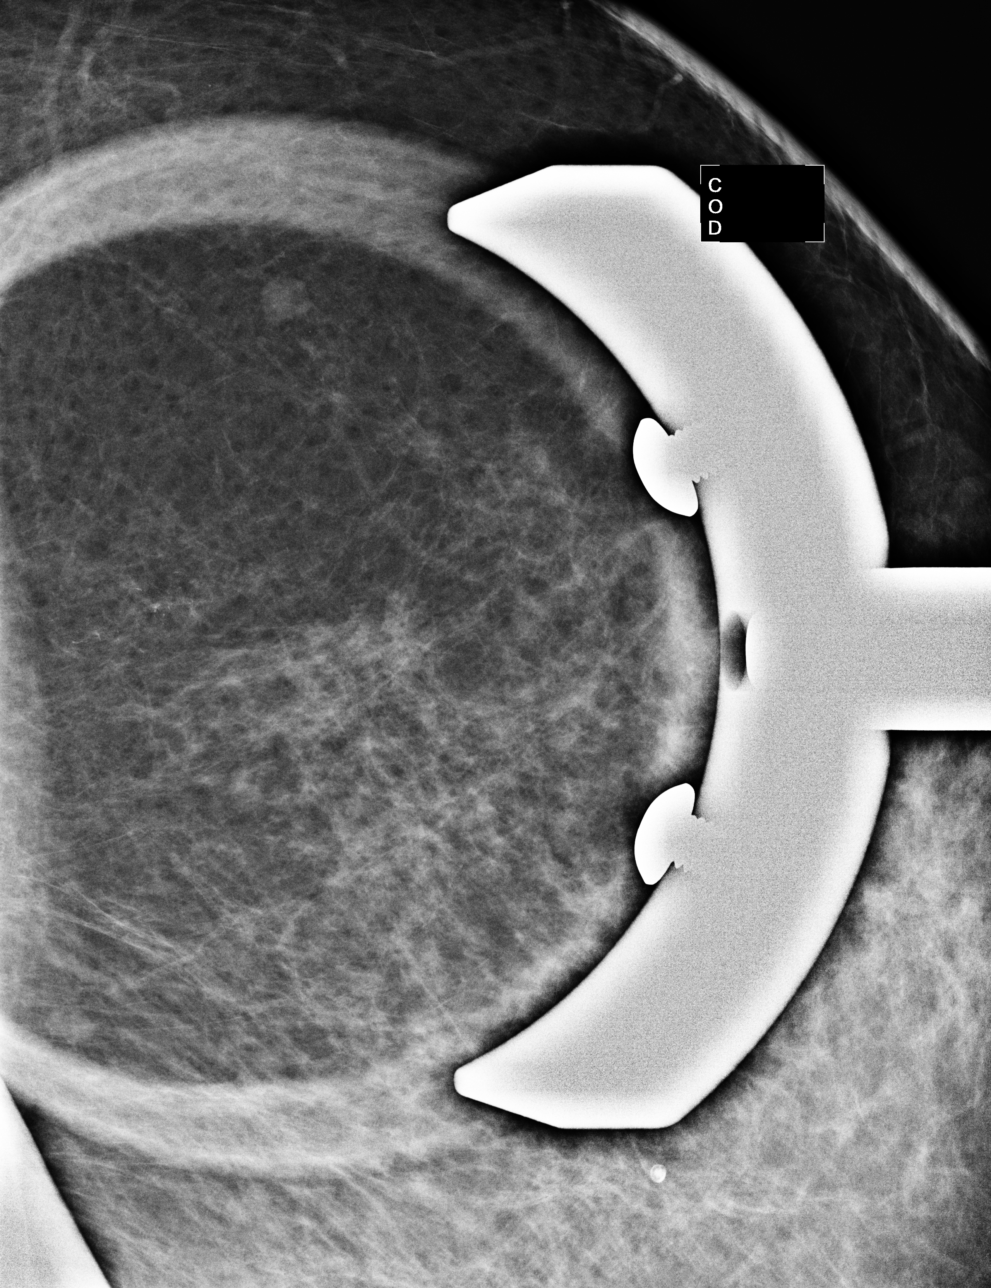

[4 of 4 positions shown; findings below may reference images not displayed]

ACR Breast Density Category c: The breasts are heterogeneously
dense, which may obscure small masses.
FINDINGS: Spot magnification views of the left breast demonstrate a 1.6 x
x 0.5 cm group of microcalcifications deep in the upper outer left
breast. These are linear and arranged in a linear fashion.
IMPRESSION: 1.6 cm group of microcalcifications deep in the upper outer left
breast, suspicious for ductal carcinoma in situ. Stereotatic guided
core needle biopsy is recommended. This has been discussed with the
patient and is scheduled to follow.

RECOMMENDATION:
Left breast stereotactic guided core needle biopsy (scheduled to
follow).

I have discussed the findings and recommendations with the patient.
Results were also provided in writing at the conclusion of the
visit. If applicable, a reminder letter will be sent to the patient
regarding the next appointment.

BI-RADS CATEGORY  4: Suspicious abnormality - biopsy should be
considered.

## 2015-08-11 DIAGNOSIS — M8589 Other specified disorders of bone density and structure, multiple sites: Secondary | ICD-10-CM | POA: Diagnosis not present

## 2015-08-11 DIAGNOSIS — E785 Hyperlipidemia, unspecified: Secondary | ICD-10-CM | POA: Diagnosis not present

## 2015-08-22 ENCOUNTER — Other Ambulatory Visit: Payer: Self-pay | Admitting: Specialist

## 2015-08-22 DIAGNOSIS — Z853 Personal history of malignant neoplasm of breast: Secondary | ICD-10-CM

## 2015-09-01 DIAGNOSIS — Z23 Encounter for immunization: Secondary | ICD-10-CM | POA: Diagnosis not present

## 2015-09-13 ENCOUNTER — Ambulatory Visit (HOSPITAL_BASED_OUTPATIENT_CLINIC_OR_DEPARTMENT_OTHER): Payer: Medicare Other | Admitting: Hematology and Oncology

## 2015-09-13 ENCOUNTER — Telehealth: Payer: Self-pay | Admitting: Hematology and Oncology

## 2015-09-13 ENCOUNTER — Encounter: Payer: Self-pay | Admitting: Hematology and Oncology

## 2015-09-13 VITALS — BP 160/74 | HR 87 | Temp 97.1°F | Resp 18 | Ht 66.0 in | Wt 159.7 lb

## 2015-09-13 DIAGNOSIS — Z79810 Long term (current) use of selective estrogen receptor modulators (SERMs): Secondary | ICD-10-CM

## 2015-09-13 DIAGNOSIS — Z170 Estrogen receptor positive status [ER+]: Secondary | ICD-10-CM

## 2015-09-13 DIAGNOSIS — D0512 Intraductal carcinoma in situ of left breast: Secondary | ICD-10-CM | POA: Diagnosis not present

## 2015-09-13 NOTE — Telephone Encounter (Signed)
Appointments made and avs printed for the patient

## 2015-09-13 NOTE — Progress Notes (Signed)
Patient Care Team: Provider Not In System as PCP - [place], MD as Referring Physician (Gynecology)  DIAGNOSIS: No matching staging information was found for the patient.  SUMMARY OF ONCOLOGIC HISTORY:   Breast cancer of upper-outer quadrant of left female breast ([place]) (Resolved)   12/08/2013 Surgery intermediate grade DCIS with comedonecrosis and calcifications. 2.1 cm. The anterior margin focally positive reexcision that showed no residual tumor. Er100% PR 96%   01/11/2014 - 02/19/2014 Radiation Therapy Adjuvant XRT   02/25/2014 -  Anti-estrogen oral therapy Tamoxifen 20 mg daily    CHIEF COMPLIANT: follow-up on tamoxifen  INTERVAL HISTORY: Makayla [name] is a sodium 89 with above-mentioned history left breast cancer currently on adjuvant antiestrogen therapy with tamoxifen. She is tolerating it fairly well. She does have occasional hot flashes. Denies any myalgias. She is very much looking forward to Thanksgiving holiday.  REVIEW OF SYSTEMS:   Constitutional: Denies fevers, chills or abnormal weight loss Eyes: Denies blurriness of vision Ears, nose, mouth, throat, and face: Denies mucositis or sore throat Respiratory: Denies cough, dyspnea or wheezes Cardiovascular: Denies palpitation, chest discomfort or lower extremity swelling Gastrointestinal:  Denies nausea, heartburn or change in bowel habits Skin: Denies abnormal skin rashes Lymphatics: Denies new lymphadenopathy or easy bruising Neurological:Denies numbness, tingling or new weaknesses Behavioral/Psych: Mood is stable, no new changes  Breast:  denies any pain or lumps or nodules in either breasts All other systems were reviewed with the patient and are negative.  I have reviewed the past medical history, past surgical history, social history and family history with the patient and they are unchanged from previous note.  ALLERGIES:  is allergic to codeine and sulfa antibiotics.  MEDICATIONS:  Current  Outpatient Prescriptions  Medication Sig Dispense Refill  . Apremilast 30 MG TABS Take 30 mg by mouth 2 (two) times daily.    Marland Kitchen aspirin 81 MG tablet Take 81 mg by mouth daily.    Marland Kitchen atorvastatin (LIPITOR) 10 MG tablet Take 10 mg by mouth daily.    . Calcium-Vitamin D-Vitamin K (VIACTIV PO) Take 1,000 mg by mouth daily.    Marland Kitchen loratadine (CLARITIN) 10 MG tablet Take 10 mg by mouth daily.    . Multiple Vitamin (MULTIVITAMIN) tablet Take 1 tablet by mouth daily.    . niacin 250 MG tablet Take 250 mg by mouth at bedtime.    . Omega-3 Fatty Acids (FISH OIL) 1000 MG CAPS Take by mouth daily.    . tamoxifen (NOLVADEX) 20 MG tablet Take 1 tablet (20 mg total) by mouth daily. 90 tablet 3  . tamoxifen (NOLVADEX) 20 MG tablet TAKE 1 TABLET DAILY 90 tablet 2   No current facility-administered medications for this visit.    PHYSICAL EXAMINATION: ECOG PERFORMANCE STATUS: 1 - Symptomatic but completely ambulatory  Filed Vitals:   09/13/15 0942  BP: 160/74  Pulse: 87  Temp: 97.1 F (36.2 C)  Resp: 18   Filed Weights   09/13/15 0942  Weight: 159 lb 11.2 oz (72.439 kg)    GENERAL:alert, no distress and comfortable SKIN: skin color, texture, turgor are normal, no rashes or significant lesions EYES: normal, Conjunctiva are pink and non-injected, sclera clear OROPHARYNX:no exudate, no erythema and lips, buccal mucosa, and tongue normal  NECK: supple, thyroid normal size, non-tender, without nodularity LYMPH:  no palpable lymphadenopathy in the cervical, axillary or inguinal LUNGS: clear to auscultation and percussion with normal breathing effort HEART: regular rate & rhythm and no murmurs and no lower extremity  edema ABDOMEN:abdomen soft, non-tender and normal bowel sounds Musculoskeletal:no cyanosis of digits and no clubbing  NEURO: alert & oriented x 3 with fluent speech, no focal motor/sensory deficits BREAST: No palpable masses or nodules in either right or left breasts. No palpable axillary  supraclavicular or infraclavicular adenopathy no breast tenderness or nipple discharge. (exam performed in the presence of a chaperone)  LABORATORY DATA:  I have reviewed the data as listed   Chemistry      Component Value Date/Time   NA 142 09/09/2014 0951   K 4.6 09/09/2014 0951   CO2 25 09/09/2014 0951   BUN 18.8 09/09/2014 0951   CREATININE 1.0 09/09/2014 0951      Component Value Date/Time   CALCIUM 9.9 09/09/2014 0951   ALKPHOS 47 09/09/2014 0951   AST 30 09/09/2014 0951   ALT 25 09/09/2014 0951   BILITOT 0.55 09/09/2014 0951       Lab Results  Component Value Date   WBC 6.9 09/09/2014   HGB 13.3 09/09/2014   HCT 40.5 09/09/2014   MCV 93.3 09/09/2014   PLT 272 09/09/2014   NEUTROABS 3.8 09/09/2014   ASSESSMENT & PLAN:  Ductal carcinoma in situ (DCIS) of left breast Ductal carcinoma in situ-stage 0 (Tis NX) with comedonecrosis measuring 2.1 cm,ER positive, PR positive. Patient is status post lumpectomy on 11/04/2013. Status post radiation and currently on tamoxifen started April 2015 plan is for 5 years  Tamoxifen Toxicities: 1. Leg cramps 2. Occasional hot flashes   Breast Cancer Surveillance: 1. Breast exam 09/13/15: Normal 2. Mammogram 09/27/14 No abnormalities. Postsurgical changes. Breast Density Category B. I recommended that she get 3-D mammograms for surveillance. Discussed the differences between different breast density categories.  RTC in 6 months and after that once a year   No orders of the defined types were placed in this encounter.   The patient has a good understanding of the overall plan. she agrees with it. she will call with any problems that may develop before the next visit here.   Rulon Eisenmenger, MD 09/13/2015

## 2015-09-13 NOTE — Assessment & Plan Note (Signed)
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 Patient is status post lumpectomy on 11/04/2013. Status post radiation and currently on tamoxifen started April 2015 plan is for 5 years  Tamoxifen Toxicities: 1. Leg cramps 2. Occasional hot flashes   Breast Cancer Surveillance: 1. Breast exam 09/13/15: Normal 2. Mammogram 09/27/14 No abnormalities. Postsurgical changes. Breast Density Category B. I recommended that Makayla [name] get 3-D mammograms for surveillance. Discussed the differences between different breast density categories.  RTC in 6 months and after that once a year

## 2015-09-13 NOTE — Addendum Note (Signed)
Addended by: Prentiss Bells on: 09/13/2015 11:42 AM   Modules accepted: Orders, Medications

## 2015-09-26 ENCOUNTER — Other Ambulatory Visit: Payer: Self-pay | Admitting: Oncology

## 2015-09-26 NOTE — Telephone Encounter (Signed)
Chart reviewed.

## 2015-09-30 ENCOUNTER — Ambulatory Visit
Admission: RE | Admit: 2015-09-30 | Discharge: 2015-09-30 | Disposition: A | Discharge status: Home / Self Care | Payer: Medicare Other | Source: Ambulatory Visit | Attending: Specialist | Admitting: Specialist

## 2015-09-30 DIAGNOSIS — Z853 Personal history of malignant neoplasm of breast: Secondary | ICD-10-CM

## 2015-09-30 DIAGNOSIS — R928 Other abnormal and inconclusive findings on diagnostic imaging of breast: Secondary | ICD-10-CM | POA: Diagnosis not present

## 2015-12-06 DIAGNOSIS — B078 Other viral warts: Secondary | ICD-10-CM | POA: Diagnosis not present

## 2015-12-06 DIAGNOSIS — Z09 Encounter for follow-up examination after completed treatment for conditions other than malignant neoplasm: Secondary | ICD-10-CM | POA: Diagnosis not present

## 2015-12-06 DIAGNOSIS — Z872 Personal history of diseases of the skin and subcutaneous tissue: Secondary | ICD-10-CM | POA: Diagnosis not present

## 2015-12-06 DIAGNOSIS — L538 Other specified erythematous conditions: Secondary | ICD-10-CM | POA: Diagnosis not present

## 2015-12-06 DIAGNOSIS — L400 Psoriasis vulgaris: Secondary | ICD-10-CM | POA: Diagnosis not present

## 2015-12-06 DIAGNOSIS — L0889 Other specified local infections of the skin and subcutaneous tissue: Secondary | ICD-10-CM | POA: Diagnosis not present

## 2015-12-06 DIAGNOSIS — L298 Other pruritus: Secondary | ICD-10-CM | POA: Diagnosis not present

## 2016-03-20 ENCOUNTER — Other Ambulatory Visit: Payer: Self-pay | Admitting: Oncology

## 2016-03-20 DIAGNOSIS — D0512 Intraductal carcinoma in situ of left breast: Secondary | ICD-10-CM

## 2016-03-27 ENCOUNTER — Telehealth: Payer: Self-pay | Admitting: Hematology and Oncology

## 2016-03-27 ENCOUNTER — Encounter: Payer: Self-pay | Admitting: Hematology and Oncology

## 2016-03-27 ENCOUNTER — Ambulatory Visit (HOSPITAL_BASED_OUTPATIENT_CLINIC_OR_DEPARTMENT_OTHER): Payer: Medicare Other | Admitting: Hematology and Oncology

## 2016-03-27 VITALS — BP 153/73 | HR 84 | Temp 98.0°F | Resp 18 | Wt 159.6 lb

## 2016-03-27 DIAGNOSIS — D0512 Intraductal carcinoma in situ of left breast: Secondary | ICD-10-CM

## 2016-03-27 DIAGNOSIS — Z170 Estrogen receptor positive status [ER+]: Secondary | ICD-10-CM

## 2016-03-27 DIAGNOSIS — C50412 Malignant neoplasm of upper-outer quadrant of left female breast: Secondary | ICD-10-CM

## 2016-03-27 DIAGNOSIS — Z79810 Long term (current) use of selective estrogen receptor modulators (SERMs): Secondary | ICD-10-CM

## 2016-03-27 MED ORDER — MAGNESIUM 400 MG PO CAPS: 400.0000 mg | ORAL_CAPSULE | Freq: Once | ORAL | Status: AC

## 2016-03-27 NOTE — Progress Notes (Signed)
Patient Care Team: Provider Not In System as PCP - [place], MD as Referring Physician (Gynecology)  DIAGNOSIS: Breast cancer of upper-outer quadrant of left female breast [facility])   Staging form: Breast, AJCC 7th Edition     Clinical: Stage 0 (Tis (DCIS), NX, cM0) - Signed by Deatra Robinson, MD on 01/03/2014     Pathologic: Stage 0 (T0, NX, cM0) - Signed by Deatra Robinson, MD on 01/03/2014   SUMMARY OF ONCOLOGIC HISTORY:   Breast cancer of upper-outer quadrant of left female breast ([place])   12/08/2013 Surgery intermediate grade DCIS with comedonecrosis and calcifications. 2.1 cm. The anterior margin focally positive reexcision that showed no residual tumor. Er100% PR 96%   01/11/2014 - 02/19/2014 Radiation Therapy Adjuvant XRT   02/25/2014 -  Anti-estrogen oral therapy Tamoxifen 20 mg daily   CHIEF COMPLIANT: Follow-up on tamoxifen therapy  INTERVAL HISTORY: Makayla [name] is a 82-year-old with above-mentioned history of left breast DCIS who received adjuvant radiation after lumpectomy and is currently on tamoxifen therapy. She appears to be tolerating it fairly well. She is taking this medicine for over a year. It appears that the hot flashes and the muscle aches are slightly better. She denies any lumps or nodules in the breasts.  REVIEW OF SYSTEMS:   Constitutional: Denies fevers, chills or abnormal weight loss Eyes: Denies blurriness of vision Ears, nose, mouth, throat, and face: Denies mucositis or sore throat Respiratory: Denies cough, dyspnea or wheezes Cardiovascular: Denies palpitation, chest discomfort Gastrointestinal:  Denies nausea, heartburn or change in bowel habits Skin: Denies abnormal skin rashes Lymphatics: Denies new lymphadenopathy or easy bruising Neurological:Denies numbness, tingling or new weaknesses Behavioral/Psych: Mood is stable, no new changes  Extremities: No lower extremity edema Breast:  denies any pain or lumps or nodules in either  breasts All other systems were reviewed with the patient and are negative.  I have reviewed the past medical history, past surgical history, social history and family history with the patient and they are unchanged from previous note.  ALLERGIES:  is allergic to codeine and sulfa antibiotics.  MEDICATIONS:  Current Outpatient Prescriptions  Medication Sig Dispense Refill  . Apremilast 30 MG TABS Take 30 mg by mouth 2 (two) times daily.    Marland Kitchen aspirin 81 MG tablet Take 81 mg by mouth daily.    Marland Kitchen atorvastatin (LIPITOR) 10 MG tablet Take 10 mg by mouth daily.    . Calcium-Vitamin D-Vitamin K (VIACTIV PO) Take 1,000 mg by mouth daily.    Marland Kitchen loratadine (CLARITIN) 10 MG tablet Take 10 mg by mouth daily.    . Multiple Vitamin (MULTIVITAMIN) tablet Take 1 tablet by mouth daily.    . niacin 250 MG tablet Take 250 mg by mouth at bedtime.    . Omega-3 Fatty Acids (FISH OIL) 1000 MG CAPS Take by mouth daily.    . tamoxifen (NOLVADEX) 20 MG tablet TAKE 1 TABLET DAILY 90 tablet 0   No current facility-administered medications for this visit.    PHYSICAL EXAMINATION: ECOG PERFORMANCE STATUS: 1 - Symptomatic but completely ambulatory  Filed Vitals:   03/27/16 1012  BP: 153/73  Pulse: 84  Temp: 98 F (36.7 C)  Resp: 18   Filed Weights   03/27/16 1012  Weight: 159 lb 9.6 oz (72.394 kg)    GENERAL:alert, no distress and comfortable SKIN: skin color, texture, turgor are normal, no rashes or significant lesions EYES: normal, Conjunctiva are pink and non-injected, sclera clear OROPHARYNX:no exudate, no  erythema and lips, buccal mucosa, and tongue normal  NECK: supple, thyroid normal size, non-tender, without nodularity LYMPH:  no palpable lymphadenopathy in the cervical, axillary or inguinal LUNGS: clear to auscultation and percussion with normal breathing effort HEART: regular rate & rhythm and no murmurs and no lower extremity edema ABDOMEN:abdomen soft, non-tender and normal bowel  sounds MUSCULOSKELETAL:no cyanosis of digits and no clubbing  NEURO: alert & oriented x 3 with fluent speech, no focal motor/sensory deficits EXTREMITIES: No lower extremity edema BREAST: No palpable masses or nodules in either right or left breasts. No palpable axillary supraclavicular or infraclavicular adenopathy no breast tenderness or nipple discharge. (exam performed in the presence of a chaperone)  LABORATORY DATA:  I have reviewed the data as listed   Chemistry      Component Value Date/Time   NA 142 09/09/2014 0951   K 4.6 09/09/2014 0951   CO2 25 09/09/2014 0951   BUN 18.8 09/09/2014 0951   CREATININE 1.0 09/09/2014 0951      Component Value Date/Time   CALCIUM 9.9 09/09/2014 0951   ALKPHOS 47 09/09/2014 0951   AST 30 09/09/2014 0951   ALT 25 09/09/2014 0951   BILITOT 0.55 09/09/2014 0951       Lab Results  Component Value Date   WBC 6.9 09/09/2014   HGB 13.3 09/09/2014   HCT 40.5 09/09/2014   MCV 93.3 09/09/2014   PLT 272 09/09/2014   NEUTROABS 3.8 09/09/2014     ASSESSMENT & PLAN:  Breast cancer of upper-outer quadrant of left female breast ([facility]) Ductal carcinoma in situ-stage 0 (Tis NX) with comedonecrosis measuring 2.1 cm,ER positive, PR positive. Patient is status post lumpectomy on 11/04/2013. Status post radiation and currently on tamoxifen started April 2015 plan is for 5 years  Tamoxifen Toxicities: 1. Leg cramps 2. Occasional hot flashes   Breast Cancer Surveillance: 1. Breast exam 03/27/2016: Normal 2. Mammogram 09/30/2015 No abnormalities. Postsurgical changes. Breast Density Category B. I recommended that she get 3-D mammograms for surveillance. Discussed the differences between different breast density categories.  RTC in one year for follow-up   No orders of the defined types were placed in this encounter.   The patient has a good understanding of the overall plan. she agrees with it. she will call with any problems that may develop  before the next visit here.   Rulon Eisenmenger, MD 03/27/2016

## 2016-03-27 NOTE — Assessment & Plan Note (Signed)
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 Patient is status post lumpectomy on 11/04/2013. Status post radiation and currently on tamoxifen started April 2015 plan is for 5 years  Tamoxifen Toxicities: 1. Leg cramps 2. Occasional hot flashes   Breast Cancer Surveillance: 1. Breast exam 03/27/2016: Normal 2. Mammogram 09/30/2015 No abnormalities. Postsurgical changes. Breast Density Category B. I recommended that she get 3-D mammograms for surveillance. Discussed the differences between different breast density categories.  RTC in one year for follow-up

## 2016-03-27 NOTE — Telephone Encounter (Signed)
appt made and avs printed °

## 2016-04-30 DIAGNOSIS — Z09 Encounter for follow-up examination after completed treatment for conditions other than malignant neoplasm: Secondary | ICD-10-CM | POA: Diagnosis not present

## 2016-04-30 DIAGNOSIS — L400 Psoriasis vulgaris: Secondary | ICD-10-CM | POA: Diagnosis not present

## 2016-04-30 DIAGNOSIS — L570 Actinic keratosis: Secondary | ICD-10-CM | POA: Diagnosis not present

## 2016-04-30 DIAGNOSIS — L298 Other pruritus: Secondary | ICD-10-CM | POA: Diagnosis not present

## 2016-04-30 DIAGNOSIS — Z872 Personal history of diseases of the skin and subcutaneous tissue: Secondary | ICD-10-CM | POA: Diagnosis not present

## 2016-04-30 DIAGNOSIS — L538 Other specified erythematous conditions: Secondary | ICD-10-CM | POA: Diagnosis not present

## 2016-04-30 DIAGNOSIS — X32XXXS Exposure to sunlight, sequela: Secondary | ICD-10-CM | POA: Diagnosis not present

## 2016-05-29 ENCOUNTER — Other Ambulatory Visit: Payer: Self-pay

## 2016-05-29 DIAGNOSIS — D0512 Intraductal carcinoma in situ of left breast: Secondary | ICD-10-CM

## 2016-05-29 MED ORDER — TAMOXIFEN CITRATE 20 MG PO TABS: 20.0000 mg | ORAL_TABLET | Freq: Every day | ORAL | 3 refills | Status: DC

## 2016-06-18 DIAGNOSIS — H0289 Other specified disorders of eyelid: Secondary | ICD-10-CM | POA: Diagnosis not present

## 2016-06-18 DIAGNOSIS — H02834 Dermatochalasis of left upper eyelid: Secondary | ICD-10-CM | POA: Diagnosis not present

## 2016-06-18 DIAGNOSIS — H25813 Combined forms of age-related cataract, bilateral: Secondary | ICD-10-CM | POA: Diagnosis not present

## 2016-06-18 DIAGNOSIS — H5203 Hypermetropia, bilateral: Secondary | ICD-10-CM | POA: Diagnosis not present

## 2016-06-20 DIAGNOSIS — L03115 Cellulitis of right lower limb: Secondary | ICD-10-CM | POA: Diagnosis not present

## 2016-06-20 DIAGNOSIS — Z872 Personal history of diseases of the skin and subcutaneous tissue: Secondary | ICD-10-CM | POA: Diagnosis not present

## 2016-06-20 DIAGNOSIS — L400 Psoriasis vulgaris: Secondary | ICD-10-CM | POA: Diagnosis not present

## 2016-06-20 DIAGNOSIS — Z09 Encounter for follow-up examination after completed treatment for conditions other than malignant neoplasm: Secondary | ICD-10-CM | POA: Diagnosis not present

## 2016-06-20 DIAGNOSIS — R208 Other disturbances of skin sensation: Secondary | ICD-10-CM | POA: Diagnosis not present

## 2016-07-19 DIAGNOSIS — Z872 Personal history of diseases of the skin and subcutaneous tissue: Secondary | ICD-10-CM | POA: Diagnosis not present

## 2016-07-19 DIAGNOSIS — Z09 Encounter for follow-up examination after completed treatment for conditions other than malignant neoplasm: Secondary | ICD-10-CM | POA: Diagnosis not present

## 2016-07-19 DIAGNOSIS — L03115 Cellulitis of right lower limb: Secondary | ICD-10-CM | POA: Diagnosis not present

## 2016-07-19 DIAGNOSIS — L400 Psoriasis vulgaris: Secondary | ICD-10-CM | POA: Diagnosis not present

## 2016-08-01 DIAGNOSIS — E785 Hyperlipidemia, unspecified: Secondary | ICD-10-CM | POA: Diagnosis not present

## 2016-08-29 ENCOUNTER — Other Ambulatory Visit: Payer: Self-pay | Admitting: Specialist

## 2016-08-29 DIAGNOSIS — Z853 Personal history of malignant neoplasm of breast: Secondary | ICD-10-CM

## 2016-09-06 DIAGNOSIS — Z01419 Encounter for gynecological examination (general) (routine) without abnormal findings: Secondary | ICD-10-CM | POA: Diagnosis not present

## 2016-09-06 DIAGNOSIS — E785 Hyperlipidemia, unspecified: Secondary | ICD-10-CM | POA: Diagnosis not present

## 2016-09-12 DIAGNOSIS — Z23 Encounter for immunization: Secondary | ICD-10-CM | POA: Diagnosis not present

## 2016-10-04 DIAGNOSIS — Z872 Personal history of diseases of the skin and subcutaneous tissue: Secondary | ICD-10-CM | POA: Diagnosis not present

## 2016-10-04 DIAGNOSIS — X32XXXS Exposure to sunlight, sequela: Secondary | ICD-10-CM | POA: Diagnosis not present

## 2016-10-04 DIAGNOSIS — L570 Actinic keratosis: Secondary | ICD-10-CM | POA: Diagnosis not present

## 2016-10-04 DIAGNOSIS — L400 Psoriasis vulgaris: Secondary | ICD-10-CM | POA: Diagnosis not present

## 2016-10-05 ENCOUNTER — Ambulatory Visit
Admission: RE | Admit: 2016-10-05 | Discharge: 2016-10-05 | Disposition: A | Discharge status: Home / Self Care | Payer: Medicare Other | Source: Ambulatory Visit | Attending: Specialist | Admitting: Specialist

## 2016-10-05 DIAGNOSIS — R928 Other abnormal and inconclusive findings on diagnostic imaging of breast: Secondary | ICD-10-CM | POA: Diagnosis not present

## 2016-10-05 DIAGNOSIS — Z853 Personal history of malignant neoplasm of breast: Secondary | ICD-10-CM

## 2017-01-15 DIAGNOSIS — L400 Psoriasis vulgaris: Secondary | ICD-10-CM | POA: Diagnosis not present

## 2017-03-18 DIAGNOSIS — M2011 Hallux valgus (acquired), right foot: Secondary | ICD-10-CM | POA: Diagnosis not present

## 2017-03-18 DIAGNOSIS — M2041 Other hammer toe(s) (acquired), right foot: Secondary | ICD-10-CM | POA: Diagnosis not present

## 2017-03-18 DIAGNOSIS — M79671 Pain in right foot: Secondary | ICD-10-CM | POA: Diagnosis not present

## 2017-03-25 NOTE — Assessment & Plan Note (Signed)
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 Patient is status post lumpectomy on 11/04/2013. Status post radiation and currently on tamoxifen started April 2015 plan is for 5 years  Tamoxifen Toxicities: 1. Leg cramps 2. Occasional hot flashes   Breast Cancer Surveillance: 1. Breast exam 03/26/17: Normal 2. Mammogram 10/05/16 No abnormalities. Postsurgical changes. Breast Density Category B. I recommended that she get 3-D mammograms for surveillance. Discussed the differences between different breast density categories.  RTC in one year for follow-up

## 2017-03-26 ENCOUNTER — Ambulatory Visit (HOSPITAL_BASED_OUTPATIENT_CLINIC_OR_DEPARTMENT_OTHER): Payer: Medicare Other | Admitting: Hematology and Oncology

## 2017-03-26 ENCOUNTER — Encounter: Payer: Self-pay | Admitting: Hematology and Oncology

## 2017-03-26 DIAGNOSIS — C50412 Malignant neoplasm of upper-outer quadrant of left female breast: Secondary | ICD-10-CM

## 2017-03-26 DIAGNOSIS — Z170 Estrogen receptor positive status [ER+]: Secondary | ICD-10-CM

## 2017-03-26 DIAGNOSIS — D0512 Intraductal carcinoma in situ of left breast: Secondary | ICD-10-CM | POA: Diagnosis not present

## 2017-03-26 MED ORDER — TAMOXIFEN CITRATE 20 MG PO TABS: 20.0000 mg | ORAL_TABLET | Freq: Every day | ORAL | 3 refills | Status: DC

## 2017-03-26 MED ORDER — TURMERIC 500 MG PO CAPS: 500.0000 mg | ORAL_CAPSULE | Freq: Every day | ORAL | Status: AC

## 2017-03-26 NOTE — Progress Notes (Signed)
Patient Care Team: System, Provider Not In as PCP - General Jamey Reas, MD as Referring Physician (Gynecology)  DIAGNOSIS:  Encounter Diagnoses  Name Primary?  . Malignant neoplasm of upper-outer quadrant of left breast in female, estrogen receptor positive ([place])   . Ductal carcinoma in situ of left breast     SUMMARY OF ONCOLOGIC HISTORY:   Breast cancer of upper-outer quadrant of left female breast ([place])   12/08/2013 Surgery    intermediate grade DCIS with comedonecrosis and calcifications. 2.1 cm. The anterior margin focally positive reexcision that showed no residual tumor. Er100% PR 96%      01/11/2014 - 02/19/2014 Radiation Therapy    Adjuvant XRT      02/25/2014 -  Anti-estrogen oral therapy    Tamoxifen 20 mg daily       CHIEF COMPLIANT: Follow-up on tamoxifen therapy  INTERVAL HISTORY: Makayla [name] is a 83 year old with above-mentioned history left breast DCIS who underwent surgery followed by radiation and is currently on tamoxifen since April 2015. She complains of leg cramps and myalgias intermittently. She also does have occasional hot flashes. She denies any lumps or nodules in the breasts.  REVIEW OF SYSTEMS:   Constitutional: Denies fevers, chills or abnormal weight loss Eyes: Denies blurriness of vision Ears, nose, mouth, throat, and face: Denies mucositis or sore throat Respiratory: Denies cough, dyspnea or wheezes Cardiovascular: Denies palpitation, chest discomfort Gastrointestinal:  Denies nausea, heartburn or change in bowel habits Skin: Denies abnormal skin rashes Lymphatics: Denies new lymphadenopathy or easy bruising Neurological:Denies numbness, tingling or new weaknesses Behavioral/Psych: Mood is stable, no new changes  Extremities: No lower extremity edema Breast:  denies any pain or lumps or nodules in either breasts All other systems were reviewed with the patient and are negative.  I have reviewed the past medical history, past  surgical history, social history and family history with the patient and they are unchanged from previous note.  ALLERGIES:  is allergic to codeine and sulfa antibiotics.  MEDICATIONS:  Current Outpatient Prescriptions  Medication Sig Dispense Refill  . aspirin 81 MG tablet Take 81 mg by mouth daily.    Marland Kitchen atorvastatin (LIPITOR) 10 MG tablet Take 10 mg by mouth daily.    . Calcium-Vitamin D-Vitamin K (VIACTIV PO) Take 1,000 mg by mouth daily.    Marland Kitchen loratadine (CLARITIN) 10 MG tablet Take 10 mg by mouth daily.    . Magnesium 400 MG CAPS Take 400 mg by mouth once.    . Multiple Vitamin (MULTIVITAMIN) tablet Take 1 tablet by mouth daily.    . Omega-3 Fatty Acids (FISH OIL) 1000 MG CAPS Take by mouth daily.    . tamoxifen (NOLVADEX) 20 MG tablet Take 1 tablet (20 mg total) by mouth daily. 90 tablet 3  . Turmeric 500 MG CAPS Take 500 mg by mouth daily.     No current facility-administered medications for this visit.     PHYSICAL EXAMINATION: ECOG PERFORMANCE STATUS: 1 - Symptomatic but completely ambulatory  Vitals:   03/26/17 1110  BP: (!) 152/69  Pulse: 62  Resp: 18  Temp: 97.6 F (36.4 C)   Filed Weights   03/26/17 1110  Weight: 155 lb 12.8 oz (70.7 kg)    GENERAL:alert, no distress and comfortable SKIN: skin color, texture, turgor are normal, no rashes or significant lesions EYES: normal, Conjunctiva are pink and non-injected, sclera clear OROPHARYNX:no exudate, no erythema and lips, buccal mucosa, and tongue normal  NECK: supple, thyroid normal size, non-tender, without  nodularity LYMPH:  no palpable lymphadenopathy in the cervical, axillary or inguinal LUNGS: clear to auscultation and percussion with normal breathing effort HEART: regular rate & rhythm and no murmurs and no lower extremity edema ABDOMEN:abdomen soft, non-tender and normal bowel sounds MUSCULOSKELETAL:no cyanosis of digits and no clubbing  NEURO: alert & oriented x 3 with fluent speech, no focal  motor/sensory deficits EXTREMITIES: No lower extremity edema BREAST: No palpable masses or nodules in either right or left breasts. No palpable axillary supraclavicular or infraclavicular adenopathy no breast tenderness or nipple discharge. (exam performed in the presence of a chaperone)  LABORATORY DATA:  I have reviewed the data as listed   Chemistry      Component Value Date/Time   NA 142 09/09/2014 0951   K 4.6 09/09/2014 0951   CO2 25 09/09/2014 0951   BUN 18.8 09/09/2014 0951   CREATININE 1.0 09/09/2014 0951      Component Value Date/Time   CALCIUM 9.9 09/09/2014 0951   ALKPHOS 47 09/09/2014 0951   AST 30 09/09/2014 0951   ALT 25 09/09/2014 0951   BILITOT 0.55 09/09/2014 0951       Lab Results  Component Value Date   WBC 6.9 09/09/2014   HGB 13.3 09/09/2014   HCT 40.5 09/09/2014   MCV 93.3 09/09/2014   PLT 272 09/09/2014   NEUTROABS 3.8 09/09/2014    ASSESSMENT & PLAN:  Breast cancer of upper-outer quadrant of left female breast ([facility]) Ductal carcinoma in situ-stage 0 (Tis NX) with comedonecrosis measuring 2.1 cm,ER positive, PR positive. Patient is status post lumpectomy on 11/04/2013. Status post radiation and currently on tamoxifen started April 2015 plan is for 5 years  Tamoxifen Toxicities: 1. Leg cramps 2. Occasional hot flashes   Breast Cancer Surveillance: 1. Breast exam 03/26/17: Normal 2. Mammogram 10/05/16 No abnormalities. Postsurgical changes. Breast Density Category B. I recommended that she get 3-D mammograms for surveillance. Discussed the differences between different breast density categories.  RTC in one year for follow-up   I spent 25 minutes talking to the patient of which more than half was spent in counseling and coordination of care.  No orders of the defined types were placed in this encounter.  The patient has a good understanding of the overall plan. she agrees with it. she will call with any problems that may develop before  the next visit here.   Rulon Eisenmenger, MD 03/26/17

## 2017-04-16 DIAGNOSIS — L400 Psoriasis vulgaris: Secondary | ICD-10-CM | POA: Diagnosis not present

## 2017-04-16 DIAGNOSIS — L821 Other seborrheic keratosis: Secondary | ICD-10-CM | POA: Diagnosis not present

## 2017-06-24 DIAGNOSIS — H524 Presbyopia: Secondary | ICD-10-CM | POA: Diagnosis not present

## 2017-06-24 DIAGNOSIS — H5203 Hypermetropia, bilateral: Secondary | ICD-10-CM | POA: Diagnosis not present

## 2017-06-24 DIAGNOSIS — H52223 Regular astigmatism, bilateral: Secondary | ICD-10-CM | POA: Diagnosis not present

## 2017-06-24 DIAGNOSIS — H25813 Combined forms of age-related cataract, bilateral: Secondary | ICD-10-CM | POA: Diagnosis not present

## 2017-07-24 DIAGNOSIS — J309 Allergic rhinitis, unspecified: Secondary | ICD-10-CM | POA: Diagnosis not present

## 2017-07-24 DIAGNOSIS — J019 Acute sinusitis, unspecified: Secondary | ICD-10-CM | POA: Diagnosis not present

## 2017-08-02 DIAGNOSIS — M2011 Hallux valgus (acquired), right foot: Secondary | ICD-10-CM | POA: Diagnosis not present

## 2017-08-02 DIAGNOSIS — M79671 Pain in right foot: Secondary | ICD-10-CM | POA: Diagnosis not present

## 2017-08-02 DIAGNOSIS — M2041 Other hammer toe(s) (acquired), right foot: Secondary | ICD-10-CM | POA: Diagnosis not present

## 2017-08-26 ENCOUNTER — Other Ambulatory Visit: Payer: Self-pay | Admitting: Specialist

## 2017-08-26 DIAGNOSIS — Z9889 Other specified postprocedural states: Secondary | ICD-10-CM

## 2017-09-03 DIAGNOSIS — Z23 Encounter for immunization: Secondary | ICD-10-CM | POA: Diagnosis not present

## 2017-10-08 DIAGNOSIS — Z09 Encounter for follow-up examination after completed treatment for conditions other than malignant neoplasm: Secondary | ICD-10-CM | POA: Diagnosis not present

## 2017-10-08 DIAGNOSIS — L400 Psoriasis vulgaris: Secondary | ICD-10-CM | POA: Diagnosis not present

## 2017-10-08 DIAGNOSIS — Z872 Personal history of diseases of the skin and subcutaneous tissue: Secondary | ICD-10-CM | POA: Diagnosis not present

## 2017-10-16 ENCOUNTER — Ambulatory Visit
Admission: RE | Admit: 2017-10-16 | Discharge: 2017-10-16 | Disposition: A | Discharge status: Home / Self Care | Payer: Medicare Other | Source: Ambulatory Visit | Attending: Specialist | Admitting: Specialist

## 2017-10-16 DIAGNOSIS — R921 Mammographic calcification found on diagnostic imaging of breast: Secondary | ICD-10-CM | POA: Diagnosis not present

## 2017-10-16 DIAGNOSIS — Z9889 Other specified postprocedural states: Secondary | ICD-10-CM

## 2017-10-16 HISTORY — DX: Personal history of irradiation: Z92.3

## 2017-11-21 DIAGNOSIS — M25374 Other instability, right foot: Secondary | ICD-10-CM | POA: Diagnosis not present

## 2017-11-21 DIAGNOSIS — M2011 Hallux valgus (acquired), right foot: Secondary | ICD-10-CM | POA: Diagnosis not present

## 2017-11-21 DIAGNOSIS — M79671 Pain in right foot: Secondary | ICD-10-CM | POA: Diagnosis not present

## 2017-11-21 DIAGNOSIS — M2041 Other hammer toe(s) (acquired), right foot: Secondary | ICD-10-CM | POA: Diagnosis not present

## 2017-12-09 DIAGNOSIS — E785 Hyperlipidemia, unspecified: Secondary | ICD-10-CM | POA: Diagnosis not present

## 2017-12-19 DIAGNOSIS — Z01419 Encounter for gynecological examination (general) (routine) without abnormal findings: Secondary | ICD-10-CM | POA: Diagnosis not present

## 2018-01-15 DIAGNOSIS — M2011 Hallux valgus (acquired), right foot: Secondary | ICD-10-CM | POA: Diagnosis not present

## 2018-01-15 DIAGNOSIS — M2041 Other hammer toe(s) (acquired), right foot: Secondary | ICD-10-CM | POA: Diagnosis not present

## 2018-01-15 DIAGNOSIS — M25374 Other instability, right foot: Secondary | ICD-10-CM | POA: Diagnosis not present

## 2018-02-17 ENCOUNTER — Telehealth: Payer: Self-pay | Admitting: Hematology and Oncology

## 2018-02-17 NOTE — Telephone Encounter (Signed)
Called pt re appt being moved to 5/30 due to [facility] having bmdc. Couldn't leave vm - sending confirmation letter in the mail.

## 2018-02-24 ENCOUNTER — Telehealth: Payer: Self-pay | Admitting: Hematology and Oncology

## 2018-02-24 NOTE — Telephone Encounter (Signed)
patient called to reschedule °

## 2018-02-27 DIAGNOSIS — L821 Other seborrheic keratosis: Secondary | ICD-10-CM | POA: Diagnosis not present

## 2018-02-27 DIAGNOSIS — L400 Psoriasis vulgaris: Secondary | ICD-10-CM | POA: Diagnosis not present

## 2018-02-27 DIAGNOSIS — D225 Melanocytic nevi of trunk: Secondary | ICD-10-CM | POA: Diagnosis not present

## 2018-02-27 DIAGNOSIS — Z09 Encounter for follow-up examination after completed treatment for conditions other than malignant neoplasm: Secondary | ICD-10-CM | POA: Diagnosis not present

## 2018-02-27 DIAGNOSIS — Z872 Personal history of diseases of the skin and subcutaneous tissue: Secondary | ICD-10-CM | POA: Diagnosis not present

## 2018-03-03 DIAGNOSIS — M2011 Hallux valgus (acquired), right foot: Secondary | ICD-10-CM | POA: Diagnosis not present

## 2018-03-26 ENCOUNTER — Ambulatory Visit: Payer: Medicare Other | Admitting: Hematology and Oncology

## 2018-03-27 ENCOUNTER — Ambulatory Visit: Payer: Medicare Other | Admitting: Hematology and Oncology

## 2018-04-03 ENCOUNTER — Inpatient Hospital Stay: Payer: Medicare Other | Attending: Hematology and Oncology | Admitting: Hematology and Oncology

## 2018-04-03 ENCOUNTER — Telehealth: Payer: Self-pay | Admitting: Hematology and Oncology

## 2018-04-03 DIAGNOSIS — Z170 Estrogen receptor positive status [ER+]: Secondary | ICD-10-CM | POA: Insufficient documentation

## 2018-04-03 DIAGNOSIS — Z79810 Long term (current) use of selective estrogen receptor modulators (SERMs): Secondary | ICD-10-CM | POA: Diagnosis not present

## 2018-04-03 DIAGNOSIS — D0512 Intraductal carcinoma in situ of left breast: Secondary | ICD-10-CM

## 2018-04-03 DIAGNOSIS — C50412 Malignant neoplasm of upper-outer quadrant of left female breast: Secondary | ICD-10-CM | POA: Insufficient documentation

## 2018-04-03 MED ORDER — TAMOXIFEN CITRATE 20 MG PO TABS: 20.0000 mg | ORAL_TABLET | Freq: Every day | ORAL | 3 refills | Status: DC

## 2018-04-03 NOTE — Progress Notes (Signed)
Patient Care Team: System, Provider Not In as PCP - General Jamey Reas, MD as Referring Physician (Gynecology)  DIAGNOSIS:  Encounter Diagnoses  Name Primary?  . Malignant neoplasm of upper-outer quadrant of left breast in female, estrogen receptor positive ([place])   . Ductal carcinoma in situ of left breast     SUMMARY OF ONCOLOGIC HISTORY:   Breast cancer of upper-outer quadrant of left female breast ([place])   12/08/2013 Surgery    intermediate grade DCIS with comedonecrosis and calcifications. 2.1 cm. The anterior margin focally positive reexcision that showed no residual tumor. Er100% PR 96%      01/11/2014 - 02/19/2014 Radiation Therapy    Adjuvant XRT      02/25/2014 -  Anti-estrogen oral therapy    Tamoxifen 20 mg daily       CHIEF COMPLIANT: Follow-up on tamoxifen therapy  INTERVAL HISTORY: Makayla [name] is a 84 year old with above-mentioned history of DCIS of left breast underwent lumpectomy followed by radiation is currently on tamoxifen therapy.  She appears to be tolerating it extremely well.  She finished 4 years of therapy.  She will complete her tamoxifen therapy by April 2020.  She reports no problems tolerating it.  She denies any lumps or nodules in the breast.  REVIEW OF SYSTEMS:   Constitutional: Denies fevers, chills or abnormal weight loss Eyes: Denies blurriness of vision Ears, nose, mouth, throat, and face: Denies mucositis or sore throat Respiratory: Denies cough, dyspnea or wheezes Cardiovascular: Denies palpitation, chest discomfort Gastrointestinal:  Denies nausea, heartburn or change in bowel habits Skin: Denies abnormal skin rashes Lymphatics: Denies new lymphadenopathy or easy bruising Neurological:Denies numbness, tingling or new weaknesses Behavioral/Psych: Mood is stable, no new changes  Extremities: No lower extremity edema Breast:  denies any pain or lumps or nodules in either breasts All other systems were reviewed with the patient  and are negative.  I have reviewed the past medical history, past surgical history, social history and family history with the patient and they are unchanged from previous note.  ALLERGIES:  is allergic to codeine and sulfa antibiotics.  MEDICATIONS:  Current Outpatient Medications  Medication Sig Dispense Refill  . aspirin 81 MG tablet Take 81 mg by mouth daily.    Marland Kitchen atorvastatin (LIPITOR) 10 MG tablet Take 10 mg by mouth daily.    . Calcium-Vitamin D-Vitamin K (VIACTIV PO) Take 1,000 mg by mouth daily.    Marland Kitchen loratadine (CLARITIN) 10 MG tablet Take 10 mg by mouth daily.    . Magnesium 400 MG CAPS Take 400 mg by mouth once.    . Multiple Vitamin (MULTIVITAMIN) tablet Take 1 tablet by mouth daily.    . Omega-3 Fatty Acids (FISH OIL) 1000 MG CAPS Take by mouth daily.    . tamoxifen (NOLVADEX) 20 MG tablet Take 1 tablet (20 mg total) by mouth daily. 90 tablet 3  . Turmeric 500 MG CAPS Take 500 mg by mouth daily.     No current facility-administered medications for this visit.     PHYSICAL EXAMINATION: ECOG PERFORMANCE STATUS: 1 - Symptomatic but completely ambulatory  Vitals:   04/03/18 1047  BP: (!) 148/68  Pulse: 67  Resp: 17  Temp: 97.7 F (36.5 C)  SpO2: 99%   Filed Weights   04/03/18 1047  Weight: 151 lb 12.8 oz (68.9 kg)    GENERAL:alert, no distress and comfortable SKIN: skin color, texture, turgor are normal, no rashes or significant lesions EYES: normal, Conjunctiva are pink and non-injected, sclera  clear OROPHARYNX:no exudate, no erythema and lips, buccal mucosa, and tongue normal  NECK: supple, thyroid normal size, non-tender, without nodularity LYMPH:  no palpable lymphadenopathy in the cervical, axillary or inguinal LUNGS: clear to auscultation and percussion with normal breathing effort HEART: regular rate & rhythm and no murmurs and no lower extremity edema ABDOMEN:abdomen soft, non-tender and normal bowel sounds MUSCULOSKELETAL:no cyanosis of digits and no  clubbing  NEURO: alert & oriented x 3 with fluent speech, no focal motor/sensory deficits EXTREMITIES: No lower extremity edema BREAST: No palpable masses or nodules in either right or left breasts. No palpable axillary supraclavicular or infraclavicular adenopathy no breast tenderness or nipple discharge. (exam performed in the presence of a chaperone)  LABORATORY DATA:  I have reviewed the data as listed CMP Latest Ref Rng & Units 09/09/2014 05/28/2014 12/30/2013  Glucose 70 - 140 mg/dl 109 159(H) 69(L)  BUN 7.0 - 26.0 mg/dL 18.8 17.9 19.2  Creatinine 0.6 - 1.1 mg/dL 1.0 1.1 0.9  Sodium 136 - 145 mEq/L 142 141 142  Potassium 3.5 - 5.1 mEq/L 4.6 4.6 4.4  CO2 22 - 29 mEq/L 25 26 30(H)  Calcium 8.4 - 10.4 mg/dL 9.9 10.1 10.4  Total Protein 6.4 - 8.3 g/dL 7.0 6.9 7.6  Total Bilirubin 0.20 - 1.20 mg/dL 0.55 0.47 0.42  Alkaline Phos 40 - 150 U/L 47 54 84  AST 5 - 34 U/L 30 24 19   ALT 0 - 55 U/L 25 22 16     Lab Results  Component Value Date   WBC 6.9 09/09/2014   HGB 13.3 09/09/2014   HCT 40.5 09/09/2014   MCV 93.3 09/09/2014   PLT 272 09/09/2014   NEUTROABS 3.8 09/09/2014    ASSESSMENT & PLAN:  Breast cancer of upper-outer quadrant of left female breast ([place]) Ductal carcinoma in situ-stage 0 ([place] [facility]) with comedonecrosis measuring 2.1 cm,ER positive, PR positive. Patient is status post lumpectomy on 11/04/2013. Status post radiation and currently on tamoxifen started April 2015 plan is for 5 years  Tamoxifen Toxicities: 1. Leg cramps 2. Occasional hot flashes   Breast Cancer Surveillance: 1. Breast exam 04/03/2018: Normal 2. Mammogram  10/16/2017: Benign, breast density category B  RTC in one year for follow-up  No orders of the defined types were placed in this encounter.  The patient has a good understanding of the overall plan. she agrees with it. she will call with any problems that may develop before the next visit here.   Harriette Ohara, MD 04/03/18

## 2018-04-03 NOTE — Assessment & Plan Note (Signed)
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 Patient is status post lumpectomy on 11/04/2013. Status post radiation and currently on tamoxifen started April 2015 plan is for 5 years  Tamoxifen Toxicities: 1. Leg cramps 2. Occasional hot flashes   Breast Cancer Surveillance: 1. Breast exam 04/03/2018: Normal 2. Mammogram  10/16/2017: Benign, breast density category B  RTC in one year for follow-up

## 2018-04-03 NOTE — Telephone Encounter (Signed)
Gave patient avs and calendar of upcoming June 2020 appointments.  °

## 2018-04-07 DIAGNOSIS — H52223 Regular astigmatism, bilateral: Secondary | ICD-10-CM | POA: Diagnosis not present

## 2018-04-07 DIAGNOSIS — H5203 Hypermetropia, bilateral: Secondary | ICD-10-CM | POA: Diagnosis not present

## 2018-04-07 DIAGNOSIS — H25813 Combined forms of age-related cataract, bilateral: Secondary | ICD-10-CM | POA: Diagnosis not present

## 2018-04-07 DIAGNOSIS — H524 Presbyopia: Secondary | ICD-10-CM | POA: Diagnosis not present

## 2018-04-21 DIAGNOSIS — H2511 Age-related nuclear cataract, right eye: Secondary | ICD-10-CM | POA: Diagnosis not present

## 2018-04-21 DIAGNOSIS — H2512 Age-related nuclear cataract, left eye: Secondary | ICD-10-CM | POA: Diagnosis not present

## 2018-05-29 DIAGNOSIS — H259 Unspecified age-related cataract: Secondary | ICD-10-CM | POA: Diagnosis not present

## 2018-05-29 DIAGNOSIS — H524 Presbyopia: Secondary | ICD-10-CM | POA: Diagnosis not present

## 2018-05-29 DIAGNOSIS — H2512 Age-related nuclear cataract, left eye: Secondary | ICD-10-CM | POA: Diagnosis not present

## 2018-05-30 DIAGNOSIS — H2511 Age-related nuclear cataract, right eye: Secondary | ICD-10-CM | POA: Diagnosis not present

## 2018-05-30 DIAGNOSIS — Z961 Presence of intraocular lens: Secondary | ICD-10-CM | POA: Diagnosis not present

## 2018-06-25 DIAGNOSIS — H259 Unspecified age-related cataract: Secondary | ICD-10-CM | POA: Diagnosis not present

## 2018-06-25 DIAGNOSIS — H524 Presbyopia: Secondary | ICD-10-CM | POA: Diagnosis not present

## 2018-06-25 DIAGNOSIS — H2511 Age-related nuclear cataract, right eye: Secondary | ICD-10-CM | POA: Diagnosis not present

## 2018-08-22 DIAGNOSIS — Z23 Encounter for immunization: Secondary | ICD-10-CM | POA: Diagnosis not present

## 2018-09-05 ENCOUNTER — Other Ambulatory Visit: Payer: Self-pay | Admitting: Specialist

## 2018-09-05 DIAGNOSIS — Z853 Personal history of malignant neoplasm of breast: Secondary | ICD-10-CM

## 2018-09-05 DIAGNOSIS — Z1231 Encounter for screening mammogram for malignant neoplasm of breast: Secondary | ICD-10-CM

## 2018-09-24 DIAGNOSIS — L570 Actinic keratosis: Secondary | ICD-10-CM | POA: Diagnosis not present

## 2018-09-24 DIAGNOSIS — L400 Psoriasis vulgaris: Secondary | ICD-10-CM | POA: Diagnosis not present

## 2018-09-24 DIAGNOSIS — L409 Psoriasis, unspecified: Secondary | ICD-10-CM | POA: Diagnosis not present

## 2018-09-24 DIAGNOSIS — L821 Other seborrheic keratosis: Secondary | ICD-10-CM | POA: Diagnosis not present

## 2018-09-24 DIAGNOSIS — D1801 Hemangioma of skin and subcutaneous tissue: Secondary | ICD-10-CM | POA: Diagnosis not present

## 2018-09-24 DIAGNOSIS — L814 Other melanin hyperpigmentation: Secondary | ICD-10-CM | POA: Diagnosis not present

## 2018-09-24 DIAGNOSIS — Z872 Personal history of diseases of the skin and subcutaneous tissue: Secondary | ICD-10-CM | POA: Diagnosis not present

## 2018-09-24 DIAGNOSIS — Z09 Encounter for follow-up examination after completed treatment for conditions other than malignant neoplasm: Secondary | ICD-10-CM | POA: Diagnosis not present

## 2018-10-16 ENCOUNTER — Other Ambulatory Visit: Payer: Self-pay | Admitting: Specialist

## 2018-10-16 ENCOUNTER — Other Ambulatory Visit: Payer: Self-pay | Admitting: Hematology and Oncology

## 2018-10-16 DIAGNOSIS — Z853 Personal history of malignant neoplasm of breast: Secondary | ICD-10-CM

## 2018-10-17 ENCOUNTER — Ambulatory Visit
Admission: RE | Admit: 2018-10-17 | Discharge: 2018-10-17 | Disposition: A | Discharge status: Home / Self Care | Payer: Medicare Other | Source: Ambulatory Visit | Attending: Specialist | Admitting: Specialist

## 2018-10-17 DIAGNOSIS — R928 Other abnormal and inconclusive findings on diagnostic imaging of breast: Secondary | ICD-10-CM | POA: Diagnosis not present

## 2018-10-17 DIAGNOSIS — Z853 Personal history of malignant neoplasm of breast: Secondary | ICD-10-CM

## 2019-03-25 NOTE — Assessment & Plan Note (Signed)
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 Patient is status post lumpectomy on 11/04/2013. Status post radiation and currently on tamoxifen started April 2015 plan is for 5 years  Tamoxifen Toxicities: 1. Leg cramps 2. Occasional hot flashes   Breast Cancer Surveillance: 1. Breast exam 04/03/2018: Normal 2. Mammogram12/20/2019: Benign, breast density category B  RTC in one year for follow-up

## 2019-03-30 ENCOUNTER — Telehealth: Payer: Self-pay | Admitting: Hematology and Oncology

## 2019-03-30 NOTE — Telephone Encounter (Signed)
Called patient regarding upcoming [facility] appointment, patient would prefer this be a doximity call.

## 2019-03-31 NOTE — Progress Notes (Signed)
HEMATOLOGY-ONCOLOGY DOXIMITY VISIT PROGRESS NOTE  I connected with Makayla [name] on 04/01/2019 at 11:00 AM EDT by Doximity video conference and verified that I am speaking with the correct person using two identifiers.  I discussed the limitations, risks, security and privacy concerns of performing an evaluation and management service by Doximity and the availability of in person appointments.  I also discussed with the patient that there may be a patient responsible charge related to this service. The patient expressed understanding and agreed to proceed.  Patient's Location: Home Physician Location: Clinic  CHIEF COMPLIANT: Follow-up on tamoxifen therapy  INTERVAL HISTORY: Makayla [name] is a 85 y.o. female with above-mentioned history of DCIS of the left breast who underwent lumpectomy followed by radiation and is currently on tamoxifen therapy. I last saw her a year ago. Her most recent mammogram on 10/17/2018 showed no evidence of malignancy bilaterally. She presents today over Doximity for annual follow-up.  She reports no new problems or concerns.  Denies any lumps or nodules in the breast.    Breast cancer of upper-outer quadrant of left female breast ([place])   12/08/2013 Surgery    intermediate grade DCIS with comedonecrosis and calcifications. 2.1 cm. The anterior margin focally positive reexcision that showed no residual tumor. Er100% PR 96%    01/11/2014 - 02/19/2014 Radiation Therapy    Adjuvant XRT    02/25/2014 -  Anti-estrogen oral therapy    Tamoxifen 20 mg daily, plan for 5 years     REVIEW OF SYSTEMS:   Constitutional: Denies fevers, chills or abnormal weight loss Eyes: Denies blurriness of vision Ears, nose, mouth, throat, and face: Denies mucositis or sore throat Respiratory: Denies cough, dyspnea or wheezes Cardiovascular: Denies palpitation, chest discomfort Gastrointestinal:  Denies nausea, heartburn or change in bowel habits Skin: Denies abnormal skin rashes  Lymphatics: Denies new lymphadenopathy or easy bruising Neurological:Denies numbness, tingling or new weaknesses Behavioral/Psych: Mood is stable, no new changes  Extremities: No lower extremity edema Breast: denies any pain or lumps or nodules in either breasts All other systems were reviewed with the patient and are negative.  Observations/Objective:  There were no vitals filed for this visit. There is no height or weight on file to calculate BMI.  I have reviewed the data as listed CMP Latest Ref Rng & Units 09/09/2014 05/28/2014 12/30/2013  Glucose 70 - 140 mg/dl 109 159(H) 69(L)  BUN 7.0 - 26.0 mg/dL 18.8 17.9 19.2  Creatinine 0.6 - 1.1 mg/dL 1.0 1.1 0.9  Sodium 136 - 145 mEq/L 142 141 142  Potassium 3.5 - 5.1 mEq/L 4.6 4.6 4.4  CO2 22 - 29 mEq/L 25 26 30(H)  Calcium 8.4 - 10.4 mg/dL 9.9 10.1 10.4  Total Protein 6.4 - 8.3 g/dL 7.0 6.9 7.6  Total Bilirubin 0.20 - 1.20 mg/dL 0.55 0.47 0.42  Alkaline Phos 40 - 150 U/L 47 54 84  AST 5 - 34 U/L 30 24 19   ALT 0 - 55 U/L 25 22 16     Lab Results  Component Value Date   WBC 6.9 09/09/2014   HGB 13.3 09/09/2014   HCT 40.5 09/09/2014   MCV 93.3 09/09/2014   PLT 272 09/09/2014   NEUTROABS 3.8 09/09/2014      Assessment Plan:  Breast cancer of upper-outer quadrant of left female breast ([place]) Ductal carcinoma in situ-stage 0 ([facility] NX) with comedonecrosis measuring 2.1 cm,ER positive, PR positive. Patient is status post lumpectomy on 11/04/2013. Status post radiation and currently on tamoxifen started April  2015 plan is for 5 years  Tamoxifen Toxicities: 1. Leg cramps 2. Occasional hot flashes   Breast Cancer Surveillance: 1. Breast exam 04/03/2018: Normal 2. Mammogram12/20/2019: Benign, breast density category B  Patient would like to follow with her gynecologist for annual breast exams and surveillance.  She will continue with annual mammograms. We can see her on an as-needed basis.   I discussed the assessment and  treatment plan with the patient. The patient was provided an opportunity to ask questions and all were answered. The patient agreed with the plan and demonstrated an understanding of the instructions. The patient was advised to call back or seek an in-person evaluation if the symptoms worsen or if the condition fails to improve as anticipated.   I provided 15 minutes of face-to-face Doximity time during this encounter.    Rulon Eisenmenger, MD 04/01/2019   I, Molly Dorshimer, am acting as scribe for Nicholas Lose, MD.  I have reviewed the above documentation for accuracy and completeness, and I agree with the above.

## 2019-04-01 ENCOUNTER — Inpatient Hospital Stay: Payer: Medicare Other | Attending: Hematology and Oncology | Admitting: Hematology and Oncology

## 2019-04-01 DIAGNOSIS — Z170 Estrogen receptor positive status [ER+]: Secondary | ICD-10-CM | POA: Diagnosis not present

## 2019-04-01 DIAGNOSIS — C50412 Malignant neoplasm of upper-outer quadrant of left female breast: Secondary | ICD-10-CM

## 2019-09-14 ENCOUNTER — Other Ambulatory Visit: Payer: Self-pay | Admitting: Specialist

## 2019-09-14 DIAGNOSIS — Z853 Personal history of malignant neoplasm of breast: Secondary | ICD-10-CM

## 2019-10-16 ENCOUNTER — Other Ambulatory Visit: Payer: Self-pay | Admitting: Hematology and Oncology

## 2019-10-16 DIAGNOSIS — Z853 Personal history of malignant neoplasm of breast: Secondary | ICD-10-CM

## 2019-10-19 ENCOUNTER — Other Ambulatory Visit: Payer: Self-pay

## 2019-10-19 ENCOUNTER — Ambulatory Visit
Admission: RE | Admit: 2019-10-19 | Discharge: 2019-10-19 | Disposition: A | Discharge status: Home / Self Care | Payer: Medicare Other | Source: Ambulatory Visit | Attending: Specialist | Admitting: Specialist

## 2019-10-19 DIAGNOSIS — Z853 Personal history of malignant neoplasm of breast: Secondary | ICD-10-CM

## 2020-09-12 ENCOUNTER — Other Ambulatory Visit: Payer: Self-pay | Admitting: Specialist

## 2020-09-12 DIAGNOSIS — Z1231 Encounter for screening mammogram for malignant neoplasm of breast: Secondary | ICD-10-CM

## 2020-10-20 ENCOUNTER — Other Ambulatory Visit: Payer: Self-pay

## 2020-10-20 ENCOUNTER — Ambulatory Visit
Admission: RE | Admit: 2020-10-20 | Discharge: 2020-10-20 | Disposition: A | Discharge status: Home / Self Care | Payer: Medicare Other | Source: Ambulatory Visit | Attending: Specialist | Admitting: Specialist

## 2020-10-20 DIAGNOSIS — Z1231 Encounter for screening mammogram for malignant neoplasm of breast: Secondary | ICD-10-CM

## 2021-09-11 ENCOUNTER — Other Ambulatory Visit: Payer: Self-pay | Admitting: Specialist

## 2021-09-11 DIAGNOSIS — Z1231 Encounter for screening mammogram for malignant neoplasm of breast: Secondary | ICD-10-CM

## 2021-10-25 ENCOUNTER — Ambulatory Visit
Admission: RE | Admit: 2021-10-25 | Discharge: 2021-10-25 | Disposition: A | Discharge status: Home / Self Care | Payer: Medicare Other | Source: Ambulatory Visit | Attending: Specialist | Admitting: Specialist

## 2021-10-25 ENCOUNTER — Other Ambulatory Visit: Payer: Self-pay

## 2021-10-25 DIAGNOSIS — Z1231 Encounter for screening mammogram for malignant neoplasm of breast: Secondary | ICD-10-CM

## 2022-09-10 ENCOUNTER — Other Ambulatory Visit: Payer: Self-pay | Admitting: Specialist

## 2022-09-10 DIAGNOSIS — Z1231 Encounter for screening mammogram for malignant neoplasm of breast: Secondary | ICD-10-CM

## 2022-11-09 ENCOUNTER — Ambulatory Visit
Admission: RE | Admit: 2022-11-09 | Discharge: 2022-11-09 | Disposition: A | Discharge status: Home / Self Care | Payer: Medicare Other | Source: Ambulatory Visit | Attending: Specialist | Admitting: Specialist

## 2022-11-09 DIAGNOSIS — Z1231 Encounter for screening mammogram for malignant neoplasm of breast: Secondary | ICD-10-CM

## 2023-10-04 ENCOUNTER — Other Ambulatory Visit: Payer: Self-pay | Admitting: Specialist

## 2023-10-04 DIAGNOSIS — Z1231 Encounter for screening mammogram for malignant neoplasm of breast: Secondary | ICD-10-CM

## 2023-11-19 ENCOUNTER — Ambulatory Visit
Admission: RE | Admit: 2023-11-19 | Discharge: 2023-11-19 | Disposition: A | Discharge status: Home / Self Care | Payer: Medicare Other | Source: Ambulatory Visit | Attending: Specialist | Admitting: Specialist

## 2023-11-19 DIAGNOSIS — Z1231 Encounter for screening mammogram for malignant neoplasm of breast: Secondary | ICD-10-CM

## 2024-10-09 ENCOUNTER — Other Ambulatory Visit: Payer: Self-pay | Admitting: Specialist

## 2024-10-09 DIAGNOSIS — Z1231 Encounter for screening mammogram for malignant neoplasm of breast: Secondary | ICD-10-CM

## 2024-11-19 ENCOUNTER — Ambulatory Visit
Admission: RE | Admit: 2024-11-19 | Discharge: 2024-11-19 | Disposition: A | Discharge status: Home / Self Care | Source: Ambulatory Visit | Attending: Specialist | Admitting: Specialist

## 2024-11-19 DIAGNOSIS — Z1231 Encounter for screening mammogram for malignant neoplasm of breast: Secondary | ICD-10-CM
